# Patient Record
Sex: Male | Born: 1945 | Race: White | Hispanic: No | Marital: Married | Smoking: Former smoker
Health system: Southern US, Community
[De-identification: ages and names within clinical notes are randomized; demographics above are authoritative.]

## PROBLEM LIST (undated history)

## (undated) DIAGNOSIS — M1990 Unspecified osteoarthritis, unspecified site: Secondary | ICD-10-CM

## (undated) DIAGNOSIS — I493 Ventricular premature depolarization: Secondary | ICD-10-CM

## (undated) DIAGNOSIS — C801 Malignant (primary) neoplasm, unspecified: Secondary | ICD-10-CM

## (undated) DIAGNOSIS — Z86010 Personal history of colonic polyps: Secondary | ICD-10-CM

## (undated) DIAGNOSIS — Z860101 Personal history of adenomatous and serrated colon polyps: Secondary | ICD-10-CM

## (undated) HISTORY — DX: Personal history of colon polyps: Z86.010

## (undated) HISTORY — PX: EYE SURGERY: SHX253

## (undated) HISTORY — PX: COLONOSCOPY: SHX174

## (undated) HISTORY — DX: Ventricular premature depolarization: I49.3

## (undated) HISTORY — PX: TONSILLECTOMY: SUR1361

## (undated) HISTORY — DX: Personal history of adenomatous and serrated colon polyps: Z86.0101

---

## 2002-07-21 ENCOUNTER — Ambulatory Visit (HOSPITAL_COMMUNITY): Admission: RE | Admit: 2002-07-21 | Discharge: 2002-07-21 | Payer: Self-pay | Admitting: Internal Medicine

## 2003-05-28 ENCOUNTER — Encounter: Payer: Self-pay | Admitting: Internal Medicine

## 2003-05-28 ENCOUNTER — Ambulatory Visit (HOSPITAL_COMMUNITY): Admission: RE | Admit: 2003-05-28 | Discharge: 2003-05-28 | Payer: Self-pay | Admitting: Internal Medicine

## 2008-12-29 ENCOUNTER — Encounter (HOSPITAL_COMMUNITY): Admission: RE | Admit: 2008-12-29 | Discharge: 2009-01-28 | Payer: Self-pay | Admitting: Orthopedic Surgery

## 2010-12-30 NOTE — Op Note (Signed)
NAME:  Erik Murray, Erik Murray NO.:  [id number]   MEDICAL RECORD NO.:  [id number]                   PATIENT TYPE:  AMB   LOCATION:  DAY                                  FACILITY:  [facility]   PHYSICIAN:  R. Roetta Sessions, M.D.              DATE OF BIRTH:  [birth year]   DATE OF PROCEDURE:  07/21/2002  DATE OF DISCHARGE:                                 OPERATIVE REPORT   PROCEDURE:  Colonoscopy with biopsy and snare polypectomy.   INDICATION FOR PROCEDURE:  The patient is a 65 year old gentleman referred  for colorectal cancer screening by Kingsley Callander. Ouida Sills, M.D.  He has had a  sigmoidoscopy years ago by Dr. Esmeralda Arthur but never had a full colon.  He has no  GI symptoms.  There is no family history of colorectal neoplasia.  Colonoscopy is now being done as a screening maneuver.  This approach has  been discussed with the patient at length at the bedside.  Potential risks,  benefits, and alternatives have been reviewed and questions answered.  He is  agreeable.  Please see my handwritten H&P for more information.  ASA-1.   DESCRIPTION OF PROCEDURE:  O2 saturation, blood pressure, pulse, and  respirations were monitored throughout the entire procedure.   Conscious sedation:  Versed 6 mg IV and Demerol 75 mg IV, Glucagon 0.5 mg IV  to arrest small bowel motility.   FINDINGS:  Digital rectal exam revealed no abnormalities.   Endoscopic findings:  The prep was good.   Rectum:  Examination of the rectal mucosa, including retroflexed view of the  anal verge, revealed no abnormalities.   Colon:  The colonic mucosa was surveyed from the rectosigmoid junction  through the left, transverse, and right colon to the area of the appendiceal  orifice, ileocecal valve, and cecum.  These structures were well-seen and  photographed.  The patient was noted to have a 3 mm polyp in the mid-right  colon, which was cold biopsied and removed.  There was a questionable polyp  at the base of  the ileocecal valve.  It was notable there was a considerable  amount of spasm in the right colon, which made confirming this difficult.  I  eventually gave the patient Glucagon 0.5 mg IV to slow small bowel motility  down, and I washed this area well and could not find a polyp.  From this  level the scope was slowly withdrawn.  All previously-mentioned mucosal  surfaces were again seen.  In the hepatic flexure there was a 5 mm polyp,  which was initially biopsied and appeared to be on a stalk, and I left it to  remove the remainder of it with a snare.  This was done without difficulty.  I continued to pull the scope back and re-examined the colon and once again  was unable to find any  other abnormalities.  The patient tolerated the  procedure well and was  reactivated in endoscopy.   IMPRESSION:  1. Normal rectum.  2. Polyp in the hepatic flexure snared.  Polyp in the right colon cold     biopsied/removed.  3. The remainder of the colonic mucosa appeared normal.    RECOMMENDATIONS:  1. No aspirin or arthritis medications for 10 days.  2. Follow up on pathology.  3. Further recommendations to follow.                                               Jonathon Bellows, M.D.    RMR/MEDQ  D:  07/21/2002  T:  07/21/2002  Job:  [id number]   cc:   Kingsley Callander. Ouida Sills, M.D.  [address]  [place]  [place] [address]  Fax: [phone]

## 2011-10-04 ENCOUNTER — Encounter (HOSPITAL_BASED_OUTPATIENT_CLINIC_OR_DEPARTMENT_OTHER): Payer: Self-pay | Admitting: *Deleted

## 2011-10-04 ENCOUNTER — Other Ambulatory Visit: Payer: Self-pay | Admitting: Orthopedic Surgery

## 2011-10-04 NOTE — Progress Notes (Signed)
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

## 2011-10-10 ENCOUNTER — Encounter (HOSPITAL_BASED_OUTPATIENT_CLINIC_OR_DEPARTMENT_OTHER): Payer: Self-pay | Admitting: *Deleted

## 2011-10-10 ENCOUNTER — Encounter (HOSPITAL_BASED_OUTPATIENT_CLINIC_OR_DEPARTMENT_OTHER): Payer: Self-pay | Admitting: Certified Registered"

## 2011-10-10 ENCOUNTER — Ambulatory Visit (HOSPITAL_BASED_OUTPATIENT_CLINIC_OR_DEPARTMENT_OTHER): Payer: BC Managed Care – PPO | Admitting: Certified Registered"

## 2011-10-10 ENCOUNTER — Encounter (HOSPITAL_BASED_OUTPATIENT_CLINIC_OR_DEPARTMENT_OTHER): Payer: Self-pay | Admitting: Orthopedic Surgery

## 2011-10-10 ENCOUNTER — Encounter (HOSPITAL_BASED_OUTPATIENT_CLINIC_OR_DEPARTMENT_OTHER)
Admission: RE | Disposition: A | Discharge status: Home / Self Care | Payer: Self-pay | Source: Ambulatory Visit | Attending: Orthopedic Surgery

## 2011-10-10 ENCOUNTER — Ambulatory Visit (HOSPITAL_BASED_OUTPATIENT_CLINIC_OR_DEPARTMENT_OTHER)
Admission: RE | Admit: 2011-10-10 | Discharge: 2011-10-10 | Disposition: A | Discharge status: Home / Self Care | Payer: BC Managed Care – PPO | Source: Ambulatory Visit | Attending: Orthopedic Surgery | Admitting: Orthopedic Surgery

## 2011-10-10 DIAGNOSIS — M129 Arthropathy, unspecified: Secondary | ICD-10-CM | POA: Insufficient documentation

## 2011-10-10 DIAGNOSIS — M65839 Other synovitis and tenosynovitis, unspecified forearm: Secondary | ICD-10-CM | POA: Insufficient documentation

## 2011-10-10 DIAGNOSIS — M6530 Trigger finger, unspecified finger: Secondary | ICD-10-CM | POA: Insufficient documentation

## 2011-10-10 HISTORY — DX: Unspecified osteoarthritis, unspecified site: M19.90

## 2011-10-10 HISTORY — PX: TRIGGER FINGER RELEASE: SHX641

## 2011-10-10 SURGERY — RELEASE, A1 PULLEY, FOR TRIGGER FINGER
Anesthesia: Monitor Anesthesia Care | Laterality: Right

## 2011-10-10 MED ORDER — CEFAZOLIN SODIUM-DEXTROSE 2-3 GM-% IV SOLR: 2.0000 g | INTRAVENOUS | Status: DC

## 2011-10-10 MED ORDER — ONDANSETRON HCL 4 MG/2ML IJ SOLN
INTRAMUSCULAR | Status: DC | PRN
  Administered 2011-10-10: 4 mg via INTRAVENOUS

## 2011-10-10 MED ORDER — CHLORHEXIDINE GLUCONATE 4 % EX LIQD: 60.0000 mL | Freq: Once | CUTANEOUS | Status: DC

## 2011-10-10 MED ORDER — LACTATED RINGERS IV SOLN
INTRAVENOUS | Status: DC
  Administered 2011-10-10: 08:00:00 via INTRAVENOUS

## 2011-10-10 MED ORDER — BUPIVACAINE HCL (PF) 0.25 % IJ SOLN
INTRAMUSCULAR | Status: DC | PRN
  Administered 2011-10-10: 7 mL

## 2011-10-10 MED ORDER — MIDAZOLAM HCL 2 MG/2ML IJ SOLN: 0.5000 mg | INTRAMUSCULAR | Status: DC | PRN

## 2011-10-10 MED ORDER — FENTANYL CITRATE 0.05 MG/ML IJ SOLN
INTRAMUSCULAR | Status: DC | PRN
  Administered 2011-10-10: 50 ug via INTRAVENOUS

## 2011-10-10 MED ORDER — HYDROCODONE-ACETAMINOPHEN 5-500 MG PO TABS: 1.0000 | ORAL_TABLET | ORAL | Status: AC | PRN

## 2011-10-10 MED ORDER — PROPOFOL 10 MG/ML IV EMUL
INTRAVENOUS | Status: DC | PRN
  Administered 2011-10-10: 45 ug/kg/min via INTRAVENOUS

## 2011-10-10 MED ORDER — LIDOCAINE HCL (CARDIAC) 20 MG/ML IV SOLN
INTRAVENOUS | Status: DC | PRN
  Administered 2011-10-10: 40 mg via INTRAVENOUS

## 2011-10-10 MED ORDER — FENTANYL CITRATE 0.05 MG/ML IJ SOLN: 50.0000 ug | INTRAMUSCULAR | Status: DC | PRN

## 2011-10-10 MED ORDER — LIDOCAINE HCL (PF) 0.5 % IJ SOLN
INTRAMUSCULAR | Status: DC | PRN
  Administered 2011-10-10: 35 mL via INTRATHECAL

## 2011-10-10 MED ORDER — CEFAZOLIN SODIUM 1-5 GM-% IV SOLN: 1.0000 g | INTRAVENOUS | Status: DC

## 2011-10-10 MED ORDER — MIDAZOLAM HCL 5 MG/5ML IJ SOLN
INTRAMUSCULAR | Status: DC | PRN
  Administered 2011-10-10: 0.5 mg via INTRAVENOUS
  Administered 2011-10-10: 1 mg via INTRAVENOUS

## 2011-10-10 MED ORDER — CEFAZOLIN SODIUM-DEXTROSE 2-3 GM-% IV SOLR
2.0000 g | INTRAVENOUS | Status: DC
  Administered 2011-10-10: 2 g via INTRAVENOUS

## 2011-10-10 SURGICAL SUPPLY — 32 items
BANDAGE COBAN STERILE 2 (GAUZE/BANDAGES/DRESSINGS) ×2 IMPLANT
BLADE SURG 15 STRL LF DISP TIS (BLADE) ×1 IMPLANT
BLADE SURG 15 STRL SS (BLADE) ×2
BNDG CMPR 9X4 STRL LF SNTH (GAUZE/BANDAGES/DRESSINGS) ×1
BNDG ESMARK 4X9 LF (GAUZE/BANDAGES/DRESSINGS) ×1 IMPLANT
CHLORAPREP W/TINT 26ML (MISCELLANEOUS) ×2 IMPLANT
CLOTH BEACON ORANGE TIMEOUT ST (SAFETY) ×2 IMPLANT
CORDS BIPOLAR (ELECTRODE) IMPLANT
COVER MAYO STAND STRL (DRAPES) ×2 IMPLANT
COVER TABLE BACK 60X90 (DRAPES) ×2 IMPLANT
CUFF TOURNIQUET SINGLE 18IN (TOURNIQUET CUFF) ×1 IMPLANT
DECANTER SPIKE VIAL GLASS SM (MISCELLANEOUS) IMPLANT
DRAPE EXTREMITY T 121X128X90 (DRAPE) ×2 IMPLANT
DRAPE SURG 17X23 STRL (DRAPES) ×2 IMPLANT
GAUZE XEROFORM 1X8 LF (GAUZE/BANDAGES/DRESSINGS) ×2 IMPLANT
GLOVE BIO SURGEON STRL SZ 6.5 (GLOVE) ×1 IMPLANT
GLOVE SURG ORTHO 8.0 STRL STRW (GLOVE) ×2 IMPLANT
GOWN BRE IMP PREV XXLGXLNG (GOWN DISPOSABLE) ×2 IMPLANT
GOWN PREVENTION PLUS XLARGE (GOWN DISPOSABLE) ×2 IMPLANT
NEEDLE 27GAX1X1/2 (NEEDLE) ×1 IMPLANT
NS IRRIG 1000ML POUR BTL (IV SOLUTION) ×2 IMPLANT
PACK BASIN DAY SURGERY FS (CUSTOM PROCEDURE TRAY) ×2 IMPLANT
PADDING CAST ABS 4INX4YD NS (CAST SUPPLIES)
PADDING CAST ABS COTTON 4X4 ST (CAST SUPPLIES) ×1 IMPLANT
SPONGE GAUZE 4X4 12PLY (GAUZE/BANDAGES/DRESSINGS) ×2 IMPLANT
STOCKINETTE 4X48 STRL (DRAPES) ×2 IMPLANT
SUT VICRYL RAPIDE 4/0 PS 2 (SUTURE) ×2 IMPLANT
SYR BULB 3OZ (MISCELLANEOUS) ×2 IMPLANT
SYR CONTROL 10ML LL (SYRINGE) ×1 IMPLANT
TOWEL OR 17X24 6PK STRL BLUE (TOWEL DISPOSABLE) ×4 IMPLANT
UNDERPAD 30X30 INCONTINENT (UNDERPADS AND DIAPERS) ×2 IMPLANT
WATER STERILE IRR 1000ML POUR (IV SOLUTION) ×1 IMPLANT

## 2011-10-10 NOTE — Brief Op Note (Signed)
10/10/2011  9:12 AM  PATIENT:  Erik Murray  66 y.o. male  PRE-OPERATIVE DIAGNOSIS:  Stenosising Tenosynovitis right thumb  POST-OPERATIVE DIAGNOSIS:  Stenosising Tenosynovitis right thumb  PROCEDURE:  Procedure(s) (LRB): RELEASE TRIGGER FINGER/A-1 PULLEY (Right)  SURGEON:  Surgeon(s) and Role:    * Nicki Reaper, MD - Primary  PHYSICIAN ASSISTANT:   ASSISTANTS: none   ANESTHESIA:   local and regional  EBL:  Total I/O In: 700 [I.V.:700] Out: -   BLOOD ADMINISTERED:none  DRAINS: none   LOCAL MEDICATIONS USED:  MARCAINE     SPECIMEN:  No Specimen  DISPOSITION OF SPECIMEN:  N/A  COUNTS:  YES  TOURNIQUET:  * Missing tourniquet times found for documented tourniquets in log:  [id number] *  DICTATION: .Other Dictation: Dictation Number [id number]  PLAN OF CARE: Discharge to home after PACU  PATIENT DISPOSITION:  PACU - hemodynamically stable.

## 2011-10-10 NOTE — Anesthesia Postprocedure Evaluation (Signed)
Anesthesia Post Note  Patient: Erik Murray  Procedure(s) Performed: Procedure(s) (LRB): RELEASE TRIGGER FINGER/A-1 PULLEY (Right)  Anesthesia type: General  Patient location: PACU  Post pain: Pain level controlled  Post assessment: Patient's Cardiovascular Status Stable  Last Vitals:  Filed Vitals:   10/10/11 0945  BP: 119/64  Pulse: 60  Temp:   Resp: 14    Post vital signs: Reviewed and stable  Level of consciousness: alert  Complications: No apparent anesthesia complications

## 2011-10-10 NOTE — Anesthesia Procedure Notes (Signed)
Procedure Name: MAC Date/Time: 10/10/2011 8:47 AM Performed by: Radford Pax Pre-anesthesia Checklist: Patient identified, Emergency Drugs available, Suction available, Patient being monitored and Timeout performed Patient Re-evaluated:Patient Re-evaluated prior to inductionOxygen Delivery Method: Simple face mask

## 2011-10-10 NOTE — H&P (Signed)
Erik Murray  is 23 and  comes in complaining of catching of his right thumb. He is referred by Dr. Carylon Perches for this. He states this began in September 2012 and is increasingly getting worse. He has tried Aspercreme and heat to no avail. He has no history of injury. He is not complaining of any numbness or tingling. He states it is very similar to the problem he had on his little finger. No history of diabetes, thyroid problems, arthritis or gout. He complains of intermittent, moderate, sharp pain. Activity makes this worse and inactivity also increases symptoms for him. He complains of it catching.    Past Medical History: He has no allergies and takes no medicines. He has had no surgery.  Family Medical History: Positive for diabetes, heart disease, high BP and arthritis.  Social History: He does not smoke. He drinks socially. He is married and a judge.  Review of Systems: Positive for glasses, otherwise negative for 14 points. Erik Murray is an 66 y.o. male.     Chief Complaint: STS Rt thumb HPI: seeabove  Past Medical History  Diagnosis Date  . No pertinent past medical history   . Arthritis     Past Surgical History  Procedure Date  . Tonsillectomy   . Colonoscopy     History reviewed. No pertinent family history. Social History:  reports that he has quit smoking. He quit smokeless tobacco use about 6 years ago. He reports that he drinks alcohol. His drug history not on file.  Allergies: No Known Allergies  Medications Prior to Admission  Medication Dose Route Frequency Provider Last Rate Last Dose  . ceFAZolin (ANCEF) IVPB 2 g/50 mL premix  2 g Intravenous 60 min Pre-Op Nicki Reaper, MD      . chlorhexidine (HIBICLENS) 4 % liquid 4 application  60 mL Topical Once Nicki Reaper, MD      . fentaNYL (SUBLIMAZE) injection 50-100 mcg  50-100 mcg Intravenous PRN Constance Goltz, MD      . lactated ringers infusion   Intravenous Continuous Constance Goltz, MD 10 mL/hr at 10/10/11 [phone]    . midazolam (VERSED) injection 0.5-2 mg  0.5-2 mg Intravenous PRN Constance Goltz, MD      . DISCONTD: ceFAZolin (ANCEF) IVPB 1 g/50 mL premix  1 g Intravenous 60 min Pre-Op Nicki Reaper, MD       No current outpatient prescriptions on file as of 10/10/2011.    Results for orders placed during the hospital encounter of 10/10/11 (from the past 48 hour(s))  POCT HEMOGLOBIN-HEMACUE     Status: Normal   Collection Time   10/10/11  7:37 AM      Component Value Range Comment   Hemoglobin 16.1  13.0 - 17.0 (g/dL)     No results found.   Pertinent items are noted in HPI.  Blood pressure 135/84, pulse 63, temperature 97.8 F (36.6 C), temperature source Oral, resp. rate 18, height 5\' 10"  (1.778 m), weight 102.059 kg (225 lb), SpO2 95.00%.  General appearance: alert, cooperative and appears stated age Head: Normocephalic, without obvious abnormality Neck: no adenopathy Resp: clear to auscultation bilaterally Cardio: regular rate and rhythm, S1, S2 normal, no murmur, click, rub or gallop GI: soft, non-tender; bowel sounds normal; no masses,  no organomegaly Extremities: extremities normal, atraumatic, no cyanosis or edema Pulses: 2+ and symmetric Skin: Skin color, texture, turgor normal. No rashes or lesions Neurologic: Grossly normal Incision/Wound: na  Assessment/Plan  This has recurred on his right side. We have injected this twice and would recommend surgical release to the A-1 pulley.  The pre, peri and postoperative course were discussed along with the risks and complications.  The patient is aware there is no guarantee with the surgery, possibility of infection, recurrence, injury to arteries, nerves, tendons, incomplete relief of symptoms and dystrophy.    Mailen Newborn R 10/10/2011, 8:41 AM

## 2011-10-10 NOTE — Op Note (Signed)
NAME:  Erik Murray, MILES NO.:  [id number]  MEDICAL RECORD NO.:  [id number]  LOCATION:                                 FACILITY:  PHYSICIAN:  Cindee Salt, M.D.            DATE OF BIRTH:  DATE OF PROCEDURE:  10/10/2011 DATE OF DISCHARGE:                              OPERATIVE REPORT   PREOPERATIVE DIAGNOSIS:  Stenosing tenosynovitis, right thumb.  POSTOPERATIVE DIAGNOSIS:  Stenosing tenosynovitis, right thumb.  OPERATION:  Release A1 pulley, right thumb.  SURGEON:  Cindee Salt, M.D.  ANESTHESIA:  Forearm-based IV regional with local infiltration .  ANESTHESIOLOGIST:  Janetta Hora. Gelene Mink, M.D.  HISTORY:  The patient is a 66 year old male with a history of triggering of his right thumb.  This has not responded to 2 injections of cortisone.  He is admitted now for surgical release.  Pre, peri, postoperative course, been discussed along with risks and complications. He is aware there is no guarantee with the surgery, possibility of infection, recurrence, injury to arteries, nerves, tendons, incomplete relief of symptoms, dystrophy.  Preoperative area, the patient is seen, the extremity marked by both the patient and surgeon.  Antibiotic given.  PROCEDURE:  The patient was brought to the operating room where a forearm-based IV regional anesthetic was carried out without difficulty. He was prepped using ChloraPrep, supine position with the right arm free.  A 3 minutes dry time was allowed.  Time-out taken to confirm patient procedure.  A transverse incision was made over the A1 pulley of the right thumb, carried down through subcutaneous tissue.  The A1 pulley was noted, this was found to be markedly thickened.  Retractors placed protecting the radial and ulnar neurovascular bundles.  The A1 pulley was then released on its radial aspect.  The oblique pulley was left intact.  Finger placed through full range of motion, no further triggering was noted.  The wound was  irrigated.  The skin was closed interrupted 4-0 Vicryl Rapide sutures.  A local infiltration with 0.25% Marcaine was given approximately 7 mL was used.  A sterile compressive dressing was applied.  Deflation of the tourniquet, all fingers immediately pinked.  He was taken to the recovery room for observation in satisfactory condition.          ______________________________ Cindee Salt, M.D.    GK/MEDQ  D:  10/10/2011  T:  10/10/2011  Job:  [id number]

## 2011-10-10 NOTE — Anesthesia Preprocedure Evaluation (Addendum)
Anesthesia Evaluation  Patient identified by MRN, date of birth, ID band Patient awake    Reviewed: Allergy & Precautions, H&P , NPO status , Patient's Chart, lab work & pertinent test results, reviewed documented beta blocker date and time   Airway Mallampati: II TM Distance: >3 FB Neck ROM: full    Dental   Pulmonary neg pulmonary ROS,          Cardiovascular neg cardio ROS     Neuro/Psych Negative Neurological ROS  Negative Psych ROS   GI/Hepatic negative GI ROS, Neg liver ROS,   Endo/Other  Negative Endocrine ROS  Renal/GU negative Renal ROS  Genitourinary negative   Musculoskeletal   Abdominal   Peds  Hematology negative hematology ROS (+)   Anesthesia Other Findings See surgeon's H&P   Reproductive/Obstetrics negative OB ROS                          Anesthesia Physical Anesthesia Plan  ASA: II  Anesthesia Plan: MAC and Bier Block   Post-op Pain Management:    Induction: Intravenous  Airway Management Planned: Simple Face Mask  Additional Equipment:   Intra-op Plan:   Post-operative Plan:   Informed Consent: I have reviewed the patients History and Physical, chart, labs and discussed the procedure including the risks, benefits and alternatives for the proposed anesthesia with the patient or authorized representative who has indicated his/her understanding and acceptance.     Plan Discussed with: CRNA and Surgeon  Anesthesia Plan Comments:         Anesthesia Quick Evaluation

## 2011-10-10 NOTE — Transfer of Care (Signed)
Immediate Anesthesia Transfer of Care Note  Patient: Erik Murray  Procedure(s) Performed: Procedure(s) (LRB): RELEASE TRIGGER FINGER/A-1 PULLEY (Right)  Patient Location: PACU  Anesthesia Type: Bier block  Level of Consciousness: awake, alert , oriented and patient cooperative  Airway & Oxygen Therapy: Patient Spontanous Breathing and Patient connected to face mask oxygen  Post-op Assessment: Report given to PACU RN and Post -op Vital signs reviewed and stable  Post vital signs: Reviewed and stable  Complications: No apparent anesthesia complications

## 2011-10-10 NOTE — Op Note (Signed)
Dictated number: [id number]

## 2011-10-10 NOTE — Discharge Instructions (Signed)
[facility] Instructions Hand Surgery  Wound Care: Keep your hand elevated above the level of your heart.  Do not allow it to dangle  by your side.  Keep the dressing dry and do not remove it unless your doctor advises you to do so.  He will usually change it at the time of your post-op visit.  Moving your fingers is advised to stimulate circulation but will depend on the site of your surgery.  If you have a splint applied, your doctor will advise you regarding movement.  Activity: Do not drive or operate machinery today.  Rest today and then you may return to your normal activity and work as indicated by your physician.  Diet:  Drink liquids today or eat a light diet.  You may resume a regular diet tomorrow.    General expectations: Pain for two to three days. Fingers may become slightly swollen.  Call your doctor if any of the following occur: Severe pain not relieved by pain medication. Elevated temperature. Dressing soaked with blood. Inability to move fingers. White or bluish color to fingers.[facility]  [address] [place], [place] [address] [phone]   Post Anesthesia Home Care Instructions  Activity: Get plenty of rest for the remainder of the day. A responsible adult should stay with you for 24 hours following the procedure.  For the next 24 hours, DO NOT: -Drive a car -Operate machinery -Drink alcoholic beverages -Take any medication unless instructed by your physician -Make any legal decisions or sign important papers.  Meals: Start with liquid foods such as gelatin or soup. Progress to regular foods as tolerated. Avoid greasy, spicy, heavy foods. If nausea and/or vomiting occur, drink only clear liquids until the nausea and/or vomiting subsides. Call your physician if vomiting continues.  Special Instructions/Symptoms: Your throat may feel dry or sore from the anesthesia or the breathing tube placed in your throat during surgery. If  this causes discomfort, gargle with warm salt water. The discomfort should disappear within 24 hours.   

## 2011-10-11 ENCOUNTER — Encounter (HOSPITAL_BASED_OUTPATIENT_CLINIC_OR_DEPARTMENT_OTHER): Payer: Self-pay | Admitting: Orthopedic Surgery

## 2011-12-05 ENCOUNTER — Encounter (INDEPENDENT_AMBULATORY_CARE_PROVIDER_SITE_OTHER): Payer: Self-pay | Admitting: *Deleted

## 2011-12-28 ENCOUNTER — Telehealth: Payer: Self-pay

## 2011-12-28 ENCOUNTER — Other Ambulatory Visit: Payer: Self-pay

## 2011-12-28 DIAGNOSIS — Z139 Encounter for screening, unspecified: Secondary | ICD-10-CM

## 2011-12-28 NOTE — Telephone Encounter (Signed)
OK to proceed with colonoscopy.

## 2011-12-28 NOTE — Telephone Encounter (Signed)
Rx and instructions mailed to pt.  

## 2011-12-28 NOTE — Telephone Encounter (Signed)
Gastroenterology Pre-Procedure Form       Request Date: 12/28/2011      Requesting Physician: Pt called     PATIENT INFORMATION:  Erik Murray is a 67 y.o., male (DOB=12-24-1945).  PROCEDURE: Procedure(s) requested: colonoscopy Procedure Reason: screening for colon cancer  PATIENT REVIEW QUESTIONS: The patient reports the following:   1. Diabetes Melitis: no 2. Joint replacements in the past 12 months: no 3. Major health problems in the past 3 months: no 4. Has an artificial valve or MVP:no 5. Has been advised in past to take antibiotics in advance of a procedure like teeth cleaning: no}    MEDICATIONS & ALLERGIES:    Patient reports the following regarding taking any blood thinners:   Plavix? no Aspirin?yes  Coumadin?  no  Patient confirms/reports the following medications:  Current Outpatient Prescriptions  Medication Sig Dispense Refill  . aspirin 81 MG tablet Take 81 mg by mouth daily.      . Multiple Vitamins-Minerals (MULTIVITAMIN WITH MINERALS) tablet Take 1 tablet by mouth daily.        Patient confirms/reports the following allergies:  No Known Allergies  Patient is appropriate to schedule for requested procedure(s): yes  AUTHORIZATION INFORMATION Primary Insurance:   ID #: Group #:  Pre-Cert / Auth required: Pre-Cert / Auth #:   Secondary Insurance:  ID #:   Group #:  Pre-Cert / Auth required:  Pre-Cert / Auth #:   No orders of the defined types were placed in this encounter.    SCHEDULE INFORMATION: Procedure has been scheduled as follows:  Date:   02/05/2012        Time: 8:15 AM Location: [facility] Short Stay  This Gastroenterology Pre-Precedure Form is being routed to the following provider(s) for review: R. Roetta Sessions, MD

## 2012-01-23 ENCOUNTER — Telehealth: Payer: Self-pay

## 2012-01-23 NOTE — Telephone Encounter (Signed)
Pt returned call. He has not had any new problems and no new medications.

## 2012-01-23 NOTE — Telephone Encounter (Signed)
LMOM to call to update triage.  

## 2012-01-23 NOTE — Telephone Encounter (Signed)
Okay to proceed with colonoscopy.

## 2012-01-29 ENCOUNTER — Encounter (HOSPITAL_COMMUNITY): Payer: Self-pay | Admitting: Pharmacy Technician

## 2012-02-02 MED ORDER — SODIUM CHLORIDE 0.45 % IV SOLN
Freq: Once | INTRAVENOUS | Status: AC
  Administered 2012-02-05: 08:00:00 via INTRAVENOUS

## 2012-02-05 ENCOUNTER — Encounter (HOSPITAL_COMMUNITY)
Admission: RE | Disposition: A | Discharge status: Home / Self Care | Payer: Self-pay | Source: Ambulatory Visit | Attending: Internal Medicine

## 2012-02-05 ENCOUNTER — Encounter (HOSPITAL_COMMUNITY): Payer: Self-pay | Admitting: *Deleted

## 2012-02-05 ENCOUNTER — Ambulatory Visit (HOSPITAL_COMMUNITY)
Admission: RE | Admit: 2012-02-05 | Discharge: 2012-02-05 | Disposition: A | Discharge status: Home / Self Care | Payer: BC Managed Care – PPO | Source: Ambulatory Visit | Attending: Internal Medicine | Admitting: Internal Medicine

## 2012-02-05 DIAGNOSIS — K621 Rectal polyp: Secondary | ICD-10-CM

## 2012-02-05 DIAGNOSIS — D129 Benign neoplasm of anus and anal canal: Secondary | ICD-10-CM | POA: Insufficient documentation

## 2012-02-05 DIAGNOSIS — D128 Benign neoplasm of rectum: Secondary | ICD-10-CM | POA: Insufficient documentation

## 2012-02-05 DIAGNOSIS — K620 Anal polyp: Secondary | ICD-10-CM

## 2012-02-05 DIAGNOSIS — K5730 Diverticulosis of large intestine without perforation or abscess without bleeding: Secondary | ICD-10-CM | POA: Insufficient documentation

## 2012-02-05 DIAGNOSIS — Z1211 Encounter for screening for malignant neoplasm of colon: Secondary | ICD-10-CM | POA: Insufficient documentation

## 2012-02-05 DIAGNOSIS — Z139 Encounter for screening, unspecified: Secondary | ICD-10-CM

## 2012-02-05 DIAGNOSIS — D126 Benign neoplasm of colon, unspecified: Secondary | ICD-10-CM | POA: Insufficient documentation

## 2012-02-05 HISTORY — PX: COLONOSCOPY: SHX5424

## 2012-02-05 SURGERY — COLONOSCOPY
Anesthesia: Moderate Sedation

## 2012-02-05 MED ORDER — STERILE WATER FOR IRRIGATION IR SOLN
Status: DC | PRN
  Administered 2012-02-05: 09:00:00

## 2012-02-05 MED ORDER — SODIUM CHLORIDE 0.9 % IJ SOLN
INTRAMUSCULAR | Status: DC | PRN
  Administered 2012-02-05: 3 mL

## 2012-02-05 MED ORDER — MEPERIDINE HCL 100 MG/ML IJ SOLN
INTRAMUSCULAR | Status: DC | PRN
  Administered 2012-02-05: 25 mg via INTRAVENOUS
  Administered 2012-02-05: 50 mg via INTRAVENOUS

## 2012-02-05 MED ORDER — MIDAZOLAM HCL 5 MG/5ML IJ SOLN
INTRAMUSCULAR | Status: AC
  Filled 2012-02-05: qty 10

## 2012-02-05 MED ORDER — MEPERIDINE HCL 100 MG/ML IJ SOLN
INTRAMUSCULAR | Status: AC
  Filled 2012-02-05: qty 1

## 2012-02-05 MED ORDER — MIDAZOLAM HCL 5 MG/5ML IJ SOLN
INTRAMUSCULAR | Status: DC | PRN
  Administered 2012-02-05 (×3): 1 mg via INTRAVENOUS
  Administered 2012-02-05: 2 mg via INTRAVENOUS

## 2012-02-05 NOTE — Op Note (Signed)
[facility] [address] [place], [place]  [address]  COLONOSCOPY PROCEDURE REPORT  PATIENT:  Erik, Murray  MR#:  [id number] BIRTHDATE:  08/27/1945, 66 yrs. old  GENDER:  male ENDOSCOPIST:  R. Roetta Sessions, MD FACP [facility] REF. BY:          Dr. Ouida Sills PROCEDURE DATE:  02/05/2012 PROCEDURE:  Colonoscopy with biopsy, ablation, saline assisted polypectomy  INDICATIONS:  Average risk colorectal cancer screening.  INFORMED CONSENT:  The risks, benefits, alternatives and imponderables including but not limited to bleeding, perforation as well as the possibility of a missed lesion have been reviewed. The potential for biopsy, lesion removal, etc. have also been discussed.  Questions have been answered.  All parties agreeable. Please see the history and physical in the medical record for more information.  MEDICATIONS:  Versed 5 mg IV and Demerol 75 mg IV in divided doses.  DESCRIPTION OF PROCEDURE:  After a digital rectal exam was performed, the EC-[id number] ([id number]) colonoscope was advanced from the anus through the rectum and colon to the area of the cecum, ileocecal valve and appendiceal orifice.  The cecum was deeply intubated.  These structures were well-seen and photographed for the record.  From the level of the cecum and ileocecal valve, the scope was slowly and cautiously withdrawn.  The mucosal surfaces were carefully surveyed utilizing scope tip deflection to facilitate fold flattening as needed.  The scope was pulled down into the rectum where a thorough examination including retroflexion was performed. <<PROCEDUREIMAGES>>  FINDINGS: Good preparation. Multiple rectal polyps - the largest being 5 mm, 5 cm from the anal verge. 2 adjacent diminutive polyps; otherwise normal rectum. Couple of descending colon diverticula. In the cecum there is a single diminutive polyp of adjacent to the appendiceal orifice; at the appendiceal orifice, there were 2  hyperplastic appearing polyps arising out of  2 folds in the base of the appendix. Please see photos. The remainder of the colonic mucosa appeared normal.  THERAPEUTIC / DIAGNOSTIC MANEUVERS PERFORMED:  The lone cecal polyp was cold biopsy / removed. The 2 hyperplastic appearing polyps arising out of a fold in the base of the appendix were lifted away from the colonic wall with about 1.5 cc of normal saline injected submucosally. Subsequently, these were hot snare removed. Questionable area residual polyp at the periphery of the appendical polypectomy site was ablated with the tip of the hot snare cautery unit. This was done without difficulty or apparent complication.  The largest rectal polyp was hot snare/removed. 2 adjacent smaller polyps were ablated with the tip hot snare cautery unit.  COMPLICATIONS: None  CECAL WITHDRAWAL TIME: 38 minutes  IMPRESSION: Colonic and rectal polyps-treated as described above. Few colonic diverticulosis  RECOMMENDATIONS: Followup on pathology.  ______________________________ R. Roetta Sessions, MD Caleen Essex  CC:  n. eSIGNED:   R. Roetta Sessions at 02/05/2012 09:47 AM  Kellie Shropshire, [phone]

## 2012-02-05 NOTE — Discharge Instructions (Signed)
Colonoscopy Discharge Instructions  Read the instructions outlined below and refer to this sheet in the next few weeks. These discharge instructions provide you with general information on caring for yourself after you leave the hospital. Your doctor may also give you specific instructions. While your treatment has been planned according to the most current medical practices available, unavoidable complications occasionally occur. If you have any problems or questions after discharge, call Dr. Jena Gauss at [phone]. ACTIVITY  You may resume your regular activity, but move at a slower pace for the next 24 hours.   Take frequent rest periods for the next 24 hours.   Walking will help get rid of the air and reduce the bloated feeling in your belly (abdomen).   No driving for 24 hours (because of the medicine (anesthesia) used during the test).    Do not sign any important legal documents or operate any machinery for 24 hours (because of the anesthesia used during the test).  NUTRITION  Drink plenty of fluids.   You may resume your normal diet as instructed by your doctor.   Begin with a light meal and progress to your normal diet. Heavy or fried foods are harder to digest and may make you feel sick to your stomach (nauseated).   Avoid alcoholic beverages for 24 hours or as instructed.  MEDICATIONS  You may resume your normal medications unless your doctor tells you otherwise.  WHAT YOU CAN EXPECT TODAY  Some feelings of bloating in the abdomen.   Passage of more gas than usual.   Spotting of blood in your stool or on the toilet paper.  IF YOU HAD POLYPS REMOVED DURING THE COLONOSCOPY:  No aspirin products for 7 days or as instructed.   No alcohol for 7 days or as instructed.   Eat a soft diet for the next 24 hours.  FINDING OUT THE RESULTS OF YOUR TEST Not all test results are available during your visit. If your test results are not back during the visit, make an appointment  with your caregiver to find out the results. Do not assume everything is normal if you have not heard from your caregiver or the medical facility. It is important for you to follow up on all of your test results.  SEEK IMMEDIATE MEDICAL ATTENTION IF:  You have more than a spotting of blood in your stool.   Your belly is swollen (abdominal distention).   You are nauseated or vomiting.   You have a temperature over 101.   You have abdominal pain or discomfort that is severe or gets worse throughout the day.    Polyp and diverticulosis information provided.  Further recommendations to follow pending review of pathology report.  Colon Polyps A polyp is extra tissue that grows inside your body. Colon polyps grow in the large intestine. The large intestine, also called the colon, is part of your digestive system. It is a long, hollow tube at the end of your digestive tract where your body makes and stores stool. Most polyps are not dangerous. They are benign. This means they are not cancerous. But over time, some types of polyps can turn into cancer. Polyps that are smaller than a pea are usually not harmful. But larger polyps could someday become or may already be cancerous. To be safe, doctors remove all polyps and test them.  WHO GETS POLYPS? Anyone can get polyps, but certain people are more likely than others. You may have a greater chance of getting polyps  if:  You are over 50.   You have had polyps before.   Someone in your family has had polyps.   Someone in your family has had cancer of the large intestine.   Find out if someone in your family has had polyps. You may also be more likely to get polyps if you:   Eat a lot of fatty foods.   Smoke.   Drink alcohol.   Do not exercise.   Eat too much.  SYMPTOMS  Most small polyps do not cause symptoms. People often do not know they have one until their caregiver finds it during a regular checkup or while testing them for  something else. Some people do have symptoms like these:  Bleeding from the anus. You might notice blood on your underwear or on toilet paper after you have had a bowel movement.   Constipation or diarrhea that lasts more than a week.   Blood in the stool. Blood can make stool look black or it can show up as red streaks in the stool.  If you have any of these symptoms, see your caregiver. HOW DOES THE DOCTOR TEST FOR POLYPS? The doctor can use four tests to check for polyps:  Digital rectal exam. The caregiver wears gloves and checks your rectum (the last part of the large intestine) to see if it feels normal. This test would find polyps only in the rectum. Your caregiver may need to do one of the other tests listed below to find polyps higher up in the intestine.   Barium enema. The caregiver puts a liquid called barium into your rectum before taking x-rays of your large intestine. Barium makes your intestine look white in the pictures. Polyps are dark, so they are easy to see.   Sigmoidoscopy. With this test, the caregiver can see inside your large intestine. A thin flexible tube is placed into your rectum. The device is called a sigmoidoscope, which has a light and a tiny video camera in it. The caregiver uses the sigmoidoscope to look at the last third of your large intestine.   Colonoscopy. This test is like sigmoidoscopy, but the caregiver looks at all of the large intestine. It usually requires sedation. This is the most common method for finding and removing polyps.  TREATMENT   The caregiver will remove the polyp during sigmoidoscopy or colonoscopy. The polyp is then tested for cancer.   If you have had polyps, your caregiver may want you to get tested regularly in the future.  PREVENTION  There is not one sure way to prevent polyps. You might be able to lower your risk of getting them if you:  Eat more fruits and vegetables and less fatty food.   Do not smoke.   Avoid  alcohol.   Exercise every day.   Lose weight if you are overweight.   Eating more calcium and folate can also lower your risk of getting polyps. Some foods that are rich in calcium are milk, cheese, and broccoli. Some foods that are rich in folate are chickpeas, kidney beans, and spinach.   Aspirin might help prevent polyps. Studies are under way.  Document Released: 04/26/2004 Document Revised: 07/20/2011 Document Reviewed: 10/02/2007 [facility] Patient Information 2012 [place], [place].  Diverticulitis A diverticulum is a small pouch or sac on the colon. Diverticulosis is the presence of these diverticula on the colon. Diverticulitis is the irritation (inflammation) or infection of diverticula. CAUSES  The colon and its diverticula contain bacteria. If food  particles block the tiny opening to a diverticulum, the bacteria inside can grow and cause an increase in pressure. This leads to infection and inflammation and is called diverticulitis. SYMPTOMS   Abdominal pain and tenderness. Usually, the pain is located on the left side of your abdomen. However, it could be located elsewhere.   Fever.   Bloating.   Feeling sick to your stomach (nausea).   Throwing up (vomiting).   Abnormal stools.  DIAGNOSIS  Your caregiver will take a history and perform a physical exam. Since many things can cause abdominal pain, other tests may be necessary. Tests may include:  Blood tests.   Urine tests.   X-ray of the abdomen.   CT scan of the abdomen.  Sometimes, surgery is needed to determine if diverticulitis or other conditions are causing your symptoms. TREATMENT  Most of the time, you can be treated without surgery. Treatment includes:  Resting the bowels by only having liquids for a few days. As you improve, you will need to eat a low-fiber diet.   Intravenous (IV) fluids if you are losing body fluids (dehydrated).   Antibiotic medicines that treat infections may be given.   Pain and  nausea medicine, if needed.   Surgery if the inflamed diverticulum has burst.  HOME CARE INSTRUCTIONS   Try a clear liquid diet (broth, tea, or water for as long as directed by your caregiver). You may then gradually begin a low-fiber diet as tolerated. A low-fiber diet is a diet with less than 10 grams of fiber. Choose the foods below to reduce fiber in the diet:   White breads, cereals, rice, and pasta.   Cooked fruits and vegetables or soft fresh fruits and vegetables without the skin.   Ground or well-cooked tender beef, ham, veal, lamb, pork, or poultry.   Eggs and seafood.   After your diverticulitis symptoms have improved, your caregiver may put you on a high-fiber diet. A high-fiber diet includes 14 grams of fiber for every 1000 calories consumed. For a standard 2000 calorie diet, you would need 28 grams of fiber. Follow these diet guidelines to help you increase the fiber in your diet. It is important to slowly increase the amount fiber in your diet to avoid gas, constipation, and bloating.   Choose whole-grain breads, cereals, pasta, and brown rice.   Choose fresh fruits and vegetables with the skin on. Do not overcook vegetables because the more vegetables are cooked, the more fiber is lost.   Choose more nuts, seeds, legumes, dried peas, beans, and lentils.   Look for food products that have greater than 3 grams of fiber per serving on the Nutrition Facts label.   Take all medicine as directed by your caregiver.   If your caregiver has given you a follow-up appointment, it is very important that you go. Not going could result in lasting (chronic) or permanent injury, pain, and disability. If there is any problem keeping the appointment, call to reschedule.  SEEK MEDICAL CARE IF:   Your pain does not improve.   You have a hard time advancing your diet beyond clear liquids.   Your bowel movements do not return to normal.  SEEK IMMEDIATE MEDICAL CARE IF:   Your pain  becomes worse.   You have an oral temperature above 102 F (38.9 C), not controlled by medicine.   You have repeated vomiting.   You have bloody or black, tarry stools.   Symptoms that brought you to your caregiver become  worse or are not getting better.  MAKE SURE YOU:   Understand these instructions.   Will watch your condition.   Will get help right away if you are not doing well or get worse.  Document Released: 05/10/2005 Document Revised: 07/20/2011 Document Reviewed: 09/05/2010 [facility] Patient Information 2012 [place], [place].

## 2012-02-05 NOTE — H&P (Signed)
  Primary Care Physician:  Carylon Perches, MD Primary Gastroenterologist:  Dr. Jena Gauss  Pre-Procedure History & Physical: HPI:  Erik Murray is a 66 y.o. male here for  is here for average risk screening colonoscopy. Last colonoscopy 10 years ago-negative. No bowel symptoms. Family history of colon polyps or colon cancer.  Past Medical History  Diagnosis Date  . No pertinent past medical history   . Arthritis     Past Surgical History  Procedure Date  . Tonsillectomy   . Colonoscopy   . Trigger finger release 10/10/2011    Procedure: RELEASE TRIGGER FINGER/A-1 PULLEY;  Surgeon: Nicki Reaper, MD;  Location: [facility];  Service: Orthopedics;  Laterality: Right;  right thumb    Prior to Admission medications   Medication Sig Start Date End Date Taking? Authorizing Provider  aspirin EC 81 MG tablet Take 81 mg by mouth daily.   Yes Historical Provider, MD  Multiple Vitamins-Minerals (MULTIVITAMIN WITH MINERALS) tablet Take 1 tablet by mouth daily.   Yes Historical Provider, MD    Allergies as of 12/28/2011  . (No Known Allergies)    Family History  Problem Relation Age of Onset  . Colon cancer Neg Hx     History   Social History  . Marital Status: Married    Spouse Name: N/A    Number of Children: N/A  . Years of Education: N/A   Occupational History  . Not on file.   Social History Main Topics  . Smoking status: Former Smoker -- 1.0 packs/day for 40 years    Types: Cigarettes  . Smokeless tobacco: Former Neurosurgeon    Quit date: 10/03/2005  . Alcohol Use: Yes     occasional  . Drug Use: No  . Sexually Active:    Other Topics Concern  . Not on file   Social History Narrative  . No narrative on file    Review of Systems: See HPI, otherwise negative ROS  Physical Exam: BP 136/84  Pulse 73  Temp 97.7 F (36.5 C) (Oral)  Resp 16  Ht 5\' 10"  (1.778 m)  Wt 225 lb (102.059 kg)  BMI 32.28 kg/m2  SpO2 97% General:   Alert,  Well-developed,  well-nourished, pleasant and cooperative in NAD Skin:  Intact without significant lesions or rashes. Eyes:  Sclera clear, no icterus.   Conjunctiva pink. Ears:  Normal auditory acuity. Nose:  No deformity, discharge,  or lesions. Mouth:  No deformity or lesions. Neck:  Supple; no masses or thyromegaly. No significant cervical adenopathy. Lungs:  Clear throughout to auscultation.   No wheezes, crackles, or rhonchi. No acute distress. Heart:  Regular rate and rhythm; no murmurs, clicks, rubs,  or gallops. Abdomen: Non-distended, normal bowel sounds.  Soft and nontender without appreciable mass or hepatosplenomegaly.  Pulses:  Normal pulses noted. Extremities:  Without clubbing or edema.  Impression/Plan: Pleasant 66 year old gentleman presents for average risk colorectal cancer screening via colonoscopy. The risks, benefits, limitations, alternatives and imponderables have been reviewed with the patient. Questions have been answered. All parties are agreeable.

## 2012-02-06 ENCOUNTER — Encounter: Payer: Self-pay | Admitting: Internal Medicine

## 2012-02-07 ENCOUNTER — Encounter (HOSPITAL_COMMUNITY): Payer: Self-pay | Admitting: Internal Medicine

## 2014-06-30 ENCOUNTER — Other Ambulatory Visit: Payer: Self-pay | Admitting: Urology

## 2014-06-30 ENCOUNTER — Ambulatory Visit (INDEPENDENT_AMBULATORY_CARE_PROVIDER_SITE_OTHER): Payer: Medicare Other | Admitting: Urology

## 2014-06-30 DIAGNOSIS — R972 Elevated prostate specific antigen [PSA]: Secondary | ICD-10-CM

## 2014-07-14 HISTORY — PX: PROSTATE BIOPSY: SHX241

## 2014-07-27 ENCOUNTER — Other Ambulatory Visit: Payer: Self-pay | Admitting: Urology

## 2014-07-27 DIAGNOSIS — R972 Elevated prostate specific antigen [PSA]: Secondary | ICD-10-CM

## 2014-07-28 ENCOUNTER — Ambulatory Visit (HOSPITAL_COMMUNITY)
Admission: RE | Admit: 2014-07-28 | Discharge: 2014-07-28 | Disposition: A | Discharge status: Home / Self Care | Payer: Medicare Other | Source: Ambulatory Visit | Attending: Urology | Admitting: Urology

## 2014-07-28 ENCOUNTER — Encounter (HOSPITAL_COMMUNITY): Payer: Self-pay

## 2014-07-28 VITALS — BP 129/80 | HR 76 | Temp 98.3°F | Resp 16

## 2014-07-28 DIAGNOSIS — R972 Elevated prostate specific antigen [PSA]: Secondary | ICD-10-CM | POA: Insufficient documentation

## 2014-07-28 MED ORDER — GENTAMICIN SULFATE 40 MG/ML IJ SOLN
160.0000 mg | Freq: Once | INTRAMUSCULAR | Status: AC
  Administered 2014-07-28: 160 mg via INTRAMUSCULAR

## 2014-07-28 MED ORDER — LIDOCAINE HCL (PF) 2 % IJ SOLN
INTRAMUSCULAR | Status: AC
  Administered 2014-07-28: 10 mL
  Filled 2014-07-28: qty 10

## 2014-07-28 MED ORDER — GENTAMICIN SULFATE 40 MG/ML IJ SOLN
INTRAMUSCULAR | Status: AC
  Administered 2014-07-28: 160 mg via INTRAMUSCULAR
  Filled 2014-07-28: qty 4

## 2014-07-28 MED ORDER — LIDOCAINE HCL (PF) 2 % IJ SOLN
10.0000 mL | Freq: Once | INTRAMUSCULAR | Status: AC
  Administered 2014-07-28: 10 mL

## 2014-07-28 NOTE — Discharge Instructions (Signed)
Transrectal Ultrasound-Guided Biopsy °A transrectal ultrasound-guided biopsy is a procedure to remove samples of tissue from your prostate using ultrasound images to guide the procedure. The procedure is usually done to evaluate the prostate gland of men who have an elevated prostate-specific antigen (PSA). PSA is a blood test to screen for prostate cancer. The biopsy samples are taken to check for prostate cancer.  °LET YOUR HEALTH CARE PROVIDER KNOW ABOUT: °· Any allergies you have. °· All medicines you are taking, including vitamins, herbs, eye drops, creams, and over-the-counter medicines. °· Previous problems you or members of your family have had with the use of anesthetics. °· Any blood disorders you have. °· Previous surgeries you have had. °· Medical conditions you have. °RISKS AND COMPLICATIONS °Generally, this is a safe procedure. However, as with any procedure, problems can occur. Possible problems include: °· Infection of your prostate. °· Bleeding from your rectum or blood in your urine. °· Difficulty urinating. °· Nerve damage (this is usually temporary). °· Damage to surrounding structures such as blood vessels, organs, and muscles, which would require other procedures. °BEFORE THE PROCEDURE °· Do not eat or drink anything after midnight on the night before the procedure or as directed by your health care provider. °· Take medicines only as directed by your health care provider. °· Your health care provider may have you stop taking certain medicines 5-7 days before the procedure. °· You will be given an enema before the procedure. During an enema, a liquid is injected into your rectum to clear out waste. °· You may have lab tests the day of your procedure.   °· Plan to have someone take you home after the procedure. °PROCEDURE  °· You will be given medicine to help you relax (sedative) before the procedure. An IV tube will be inserted into one of your veins and used to give fluids and  medicine. °· You will be given antibiotic medicine to reduce the risk of an infection. °· You will be placed on your side for the procedure. °· A probe with lubricated gel will be placed into your rectum, and images will be taken of your prostate and surrounding structures. °· Numbing medicine will be injected into the prostate before the biopsy samples are taken. °· A biopsy needle will then be inserted and guided to your prostate with the use of the ultrasound images. °· Samples of prostate tissue will be taken, and the needle will then be removed. °· The biopsy samples will be sent to a lab to be analyzed. Results are usually back in 2-3 days. °AFTER THE PROCEDURE °· You will be taken to a recovery area where you will be monitored. °· You may have some discomfort in the rectal area. You will be given pain medicines to control this. °· You may be allowed to go home the same day, or you may need to stay in the hospital overnight. °Document Released: 12/15/2013 Document Reviewed: 03/19/2013 °[employer]® Patient Information ©2015 [employer]. This information is not intended to replace advice given to you by your health care provider. Make sure you discuss any questions you have with your health care provider. ° °

## 2014-08-14 DIAGNOSIS — C801 Malignant (primary) neoplasm, unspecified: Secondary | ICD-10-CM

## 2014-08-14 HISTORY — DX: Malignant (primary) neoplasm, unspecified: C80.1

## 2014-09-02 ENCOUNTER — Other Ambulatory Visit: Payer: Self-pay | Admitting: Urology

## 2014-09-24 NOTE — Patient Instructions (Addendum)
KAZI REPPOND  09/24/2014   Your procedure is scheduled on: Thursday February 18th, 2016  [facility] and follow signs to               [place] at 900 AM.  Call this number if you have problems the morning of surgery [phone]   Remember: [place].  Do not eat food or drink liquids :After Midnight Tuesday February 16 th, 2016, clear liquids all day Wednesday February 17th, 2016     Take these medicines the morning of surgery with A SIP OF WATER: none                               You may not have any metal on your body including hair pins and              piercings  Do not wear jewelry, make-up, lotions, powders or perfumes.             Do not wear nail polish.  Do not shave  48 hours prior to surgery.              Men may shave face and neck.   Do not bring valuables to the hospital. [place].  Contacts, dentures or bridgework may not be worn into surgery.  Leave suitcase in the car. After surgery it may be brought to your room.     Patients discharged the day of surgery will not be allowed to drive home.  Name and phone number of your driver:  Special Instructions: N/A              Please read over the following fact sheets you were given: _____________________________________________________________________             [facility] - Preparing for Surgery Before surgery, you can play an important role.  Because skin is not sterile, your skin needs to be as free of germs as possible.  You can reduce the number of germs on your skin by washing with CHG (chlorahexidine gluconate) soap before surgery.  CHG is an antiseptic cleaner which kills germs and bonds with the skin to continue killing germs even after washing. Please DO NOT use if you have an allergy to CHG or antibacterial soaps.  If your skin becomes  reddened/irritated stop using the CHG and inform your nurse when you arrive at Short Stay. Do not shave (including legs and underarms) for at least 48 hours prior to the first CHG shower.  You may shave your face/neck. Please follow these instructions carefully:  1.  Shower with CHG Soap the night before surgery and the  morning of Surgery.  2.  If you choose to wash your hair, wash your hair first as usual with your  normal  shampoo.  3.  After you shampoo, rinse your hair and body thoroughly to remove the  shampoo.                           4.  Use CHG as you would any other liquid soap.  You can apply chg directly  to the skin and wash  Gently with a scrungie or clean washcloth.  5.  Apply the CHG Soap to your body ONLY FROM THE NECK DOWN.   Do not use on face/ open                           Wound or open sores. Avoid contact with eyes, ears mouth and genitals (private parts).                       Wash face,  Genitals (private parts) with your normal soap.             6.  Wash thoroughly, paying special attention to the area where your surgery  will be performed.  7.  Thoroughly rinse your body with warm water from the neck down.  8.  DO NOT shower/wash with your normal soap after using and rinsing off  the CHG Soap.                9.  Pat yourself dry with a clean towel.            10.  Wear clean pajamas.            11.  Place clean sheets on your bed the night of your first shower and do not  sleep with pets. Day of Surgery : Do not apply any lotions/deodorants the morning of surgery.  Please wear clean clothes to the hospital/surgery center.  FAILURE TO FOLLOW THESE INSTRUCTIONS MAY RESULT IN THE CANCELLATION OF YOUR SURGERY PATIENT SIGNATURE_________________________________  NURSE SIGNATURE__________________________________  ________________________________________________________________________   Adam Phenix  An incentive spirometer is a tool that  can help keep your lungs clear and active. This tool measures how well you are filling your lungs with each breath. Taking long deep breaths may help reverse or decrease the chance of developing breathing (pulmonary) problems (especially infection) following:  A long period of time when you are unable to move or be active. BEFORE THE PROCEDURE   If the spirometer includes an indicator to show your best effort, your nurse or respiratory therapist will set it to a desired goal.  If possible, sit up straight or lean slightly forward. Try not to slouch.  Hold the incentive spirometer in an upright position. INSTRUCTIONS FOR USE  1. Sit on the edge of your bed if possible, or sit up as far as you can in bed or on a chair. 2. Hold the incentive spirometer in an upright position. 3. Breathe out normally. 4. Place the mouthpiece in your mouth and seal your lips tightly around it. 5. Breathe in slowly and as deeply as possible, raising the piston or the ball toward the top of the column. 6. Hold your breath for 3-5 seconds or for as long as possible. Allow the piston or ball to fall to the bottom of the column. 7. Remove the mouthpiece from your mouth and breathe out normally. 8. Rest for a few seconds and repeat Steps 1 through 7 at least 10 times every 1-2 hours when you are awake. Take your time and take a few normal breaths between deep breaths. 9. The spirometer may include an indicator to show your best effort. Use the indicator as a goal to work toward during each repetition. 10. After each set of 10 deep breaths, practice coughing to be sure your lungs are clear. If you have an incision (the cut made at the time of surgery),  support your incision when coughing by placing a pillow or rolled up towels firmly against it. Once you are able to get out of bed, walk around indoors and cough well. You may stop using the incentive spirometer when instructed by your caregiver.  RISKS AND  COMPLICATIONS  Take your time so you do not get dizzy or light-headed.  If you are in pain, you may need to take or ask for pain medication before doing incentive spirometry. It is harder to take a deep breath if you are having pain. AFTER USE  Rest and breathe slowly and easily.  It can be helpful to keep track of a log of your progress. Your caregiver can provide you with a simple table to help with this. If you are using the spirometer at home, follow these instructions: [place] IF:   You are having difficultly using the spirometer.  You have trouble using the spirometer as often as instructed.  Your pain medication is not giving enough relief while using the spirometer.  You develop fever of 100.5 F (38.1 C) or higher. SEEK IMMEDIATE MEDICAL CARE IF:   You cough up bloody sputum that had not been present before.  You develop fever of 102 F (38.9 C) or greater.  You develop worsening pain at or near the incision site. MAKE SURE YOU:   Understand these instructions.  Will watch your condition.  Will get help right away if you are not doing well or get worse. Document Released: 12/11/2006 Document Revised: 10/23/2011 Document Reviewed: 02/11/2007 [facility] Patient Information 2014 [place], [place].   ________________________________________________________________________

## 2014-09-24 NOTE — Patient Instructions (Signed)
Erik Murray  09/24/2014   Your procedure is scheduled on: 10/01/2014    Report to [facility] Main  Entrance and follow signs to               [place] at     0900 AM.  Call this number if you have problems the morning of surgery [phone]   Remember:  Do not eat food or drink liquids :After Midnight.     Take these medicines the morning of surgery with A SIP OF WATER:                                You may not have any metal on your body including hair pins and              piercings  Do not wear jewelry,  lotions, powders or perfumes., deodorant                         Men may shave face and neck.   Do not bring valuables to the hospital. [place].  Contacts, dentures or bridgework may not be worn into surgery.  Leave suitcase in the car. After surgery it may be brought to your room.      Special Instructions: coughing and deep breathing exercises, leg exercises               Please read over the following fact sheets you were given: _____________________________________________________________________             [facility] - Preparing for Surgery Before surgery, you can play an important role.  Because skin is not sterile, your skin needs to be as free of germs as possible.  You can reduce the number of germs on your skin by washing with CHG (chlorahexidine gluconate) soap before surgery.  CHG is an antiseptic cleaner which kills germs and bonds with the skin to continue killing germs even after washing. Please DO NOT use if you have an allergy to CHG or antibacterial soaps.  If your skin becomes reddened/irritated stop using the CHG and inform your nurse when you arrive at Short Stay. Do not shave (including legs and underarms) for at least 48 hours prior to the first CHG shower.  You may shave your face/neck. Please follow these instructions carefully:  1.  Shower with CHG Soap  the night before surgery and the  morning of Surgery.  2.  If you choose to wash your hair, wash your hair first as usual with your  normal  shampoo.  3.  After you shampoo, rinse your hair and body thoroughly to remove the  shampoo.                           4.  Use CHG as you would any other liquid soap.  You can apply chg directly  to the skin and wash                       Gently with a scrungie or clean washcloth.  5.  Apply the CHG Soap to your body ONLY FROM THE NECK DOWN.   Do  not use on face/ open                           Wound or open sores. Avoid contact with eyes, ears mouth and genitals (private parts).                       Wash face,  Genitals (private parts) with your normal soap.             6.  Wash thoroughly, paying special attention to the area where your surgery  will be performed.  7.  Thoroughly rinse your body with warm water from the neck down.  8.  DO NOT shower/wash with your normal soap after using and rinsing off  the CHG Soap.                9.  Pat yourself dry with a clean towel.            10.  Wear clean pajamas.            11.  Place clean sheets on your bed the night of your first shower and do not  sleep with pets. Day of Surgery : Do not apply any lotions/deodorants the morning of surgery.  Please wear clean clothes to the hospital/surgery center.  FAILURE TO FOLLOW THESE INSTRUCTIONS MAY RESULT IN THE CANCELLATION OF YOUR SURGERY PATIENT SIGNATURE_________________________________  NURSE SIGNATURE__________________________________  ________________________________________________________________________  WHAT IS A BLOOD TRANSFUSION? Blood Transfusion Information  A transfusion is the replacement of blood or some of its parts. Blood is made up of multiple cells which provide different functions.  Red blood cells carry oxygen and are used for blood loss replacement.  White blood cells fight against infection.  Platelets control bleeding.  Plasma  helps clot blood.  Other blood products are available for specialized needs, such as hemophilia or other clotting disorders. BEFORE THE TRANSFUSION  Who gives blood for transfusions?   Healthy volunteers who are fully evaluated to make sure their blood is safe. This is blood bank blood. Transfusion therapy is the safest it has ever been in the practice of medicine. Before blood is taken from a donor, a complete history is taken to make sure that person has no history of diseases nor engages in risky social behavior (examples are intravenous drug use or sexual activity with multiple partners). The donor's travel history is screened to minimize risk of transmitting infections, such as malaria. The donated blood is tested for signs of infectious diseases, such as HIV and hepatitis. The blood is then tested to be sure it is compatible with you in order to minimize the chance of a transfusion reaction. If you or a relative donates blood, this is often done in anticipation of surgery and is not appropriate for emergency situations. It takes many days to process the donated blood. RISKS AND COMPLICATIONS Although transfusion therapy is very safe and saves many lives, the main dangers of transfusion include:  1. Getting an infectious disease. 2. Developing a transfusion reaction. This is an allergic reaction to something in the blood you were given. Every precaution is taken to prevent this. The decision to have a blood transfusion has been considered carefully by your caregiver before blood is given. Blood is not given unless the benefits outweigh the risks. AFTER THE TRANSFUSION  Right after receiving a blood transfusion, you will usually feel much better and more energetic. This is especially true  if your red blood cells have gotten low (anemic). The transfusion raises the level of the red blood cells which carry oxygen, and this usually causes an energy increase.  The nurse administering the transfusion  will monitor you carefully for complications. HOME CARE INSTRUCTIONS  No special instructions are needed after a transfusion. You may find your energy is better. Speak with your caregiver about any limitations on activity for underlying diseases you may have. SEEK MEDICAL CARE IF:   Your condition is not improving after your transfusion.  You develop redness or irritation at the intravenous (IV) site. SEEK IMMEDIATE MEDICAL CARE IF:  Any of the following symptoms occur over the next 12 hours:  Shaking chills.  You have a temperature by mouth above 102 F (38.9 C), not controlled by medicine.  Chest, back, or muscle pain.  People around you feel you are not acting correctly or are confused.  Shortness of breath or difficulty breathing.  Dizziness and fainting.  You get a rash or develop hives.  You have a decrease in urine output.  Your urine turns a dark color or changes to pink, red, or brown. Any of the following symptoms occur over the next 10 days:  You have a temperature by mouth above 102 F (38.9 C), not controlled by medicine.  Shortness of breath.  Weakness after normal activity.  The white part of the eye turns yellow (jaundice).  You have a decrease in the amount of urine or are urinating less often.  Your urine turns a dark color or changes to pink, red, or brown. Document Released: 07/28/2000 Document Revised: 10/23/2011 Document Reviewed: 03/16/2008 ExitCare Patient Information 2014 [place].  _______________________________________________________________________  Incentive Spirometer  An incentive spirometer is a tool that can help keep your lungs clear and active. This tool measures how well you are filling your lungs with each breath. Taking long deep breaths may help reverse or decrease the chance of developing breathing (pulmonary) problems (especially infection) following:  A long period of time when you are unable to move or be  active. BEFORE THE PROCEDURE   If the spirometer includes an indicator to show your best effort, your nurse or respiratory therapist will set it to a desired goal.  If possible, sit up straight or lean slightly forward. Try not to slouch.  Hold the incentive spirometer in an upright position. INSTRUCTIONS FOR USE  3. Sit on the edge of your bed if possible, or sit up as far as you can in bed or on a chair. 4. Hold the incentive spirometer in an upright position. 5. Breathe out normally. 6. Place the mouthpiece in your mouth and seal your lips tightly around it. 7. Breathe in slowly and as deeply as possible, raising the piston or the ball toward the top of the column. 8. Hold your breath for 3-5 seconds or for as long as possible. Allow the piston or ball to fall to the bottom of the column. 9. Remove the mouthpiece from your mouth and breathe out normally. 10. Rest for a few seconds and repeat Steps 1 through 7 at least 10 times every 1-2 hours when you are awake. Take your time and take a few normal breaths between deep breaths. 11. The spirometer may include an indicator to show your best effort. Use the indicator as a goal to work toward during each repetition. 12. After each set of 10 deep breaths, practice coughing to be sure your lungs are clear. If you have an  incision (the cut made at the time of surgery), support your incision when coughing by placing a pillow or rolled up towels firmly against it. Once you are able to get out of bed, walk around indoors and cough well. You may stop using the incentive spirometer when instructed by your caregiver.  RISKS AND COMPLICATIONS  Take your time so you do not get dizzy or light-headed.  If you are in pain, you may need to take or ask for pain medication before doing incentive spirometry. It is harder to take a deep breath if you are having pain. AFTER USE  Rest and breathe slowly and easily.  It can be helpful to keep track of a log of  your progress. Your caregiver can provide you with a simple table to help with this. If you are using the spirometer at home, follow these instructions: [place] IF:   You are having difficultly using the spirometer.  You have trouble using the spirometer as often as instructed.  Your pain medication is not giving enough relief while using the spirometer.  You develop fever of 100.5 F (38.1 C) or higher. SEEK IMMEDIATE MEDICAL CARE IF:   You cough up bloody sputum that had not been present before.  You develop fever of 102 F (38.9 C) or greater.  You develop worsening pain at or near the incision site. MAKE SURE YOU:   Understand these instructions.  Will watch your condition.  Will get help right away if you are not doing well or get worse. Document Released: 12/11/2006 Document Revised: 10/23/2011 Document Reviewed: 02/11/2007 [facility] Patient Information 2014 [place], [place].   ________________________________________________________________________

## 2014-09-25 ENCOUNTER — Encounter (HOSPITAL_COMMUNITY)
Admission: RE | Admit: 2014-09-25 | Discharge: 2014-09-25 | Disposition: A | Discharge status: Home / Self Care | Payer: Medicare Other | Source: Ambulatory Visit | Attending: Urology | Admitting: Urology

## 2014-09-25 ENCOUNTER — Encounter (HOSPITAL_COMMUNITY): Payer: Self-pay

## 2014-09-25 ENCOUNTER — Ambulatory Visit (HOSPITAL_COMMUNITY)
Admission: RE | Admit: 2014-09-25 | Discharge: 2014-09-25 | Disposition: A | Discharge status: Home / Self Care | Payer: Medicare Other | Source: Ambulatory Visit | Attending: Urology | Admitting: Urology

## 2014-09-25 DIAGNOSIS — Z01818 Encounter for other preprocedural examination: Secondary | ICD-10-CM | POA: Diagnosis present

## 2014-09-25 DIAGNOSIS — Z87891 Personal history of nicotine dependence: Secondary | ICD-10-CM | POA: Diagnosis not present

## 2014-09-25 DIAGNOSIS — R972 Elevated prostate specific antigen [PSA]: Secondary | ICD-10-CM | POA: Insufficient documentation

## 2014-09-25 DIAGNOSIS — Z0189 Encounter for other specified special examinations: Secondary | ICD-10-CM

## 2014-09-25 HISTORY — DX: Malignant (primary) neoplasm, unspecified: C80.1

## 2014-09-25 LAB — BASIC METABOLIC PANEL
ANION GAP: 10 (ref 5–15)
BUN: 18 mg/dL (ref 6–23)
CALCIUM: 8.7 mg/dL (ref 8.4–10.5)
CHLORIDE: 104 mmol/L (ref 96–112)
CO2: 25 mmol/L (ref 19–32)
Creatinine, Ser: 1.2 mg/dL (ref 0.50–1.35)
GFR calc Af Amer: 71 mL/min — ABNORMAL LOW (ref >90)
GFR calc non Af Amer: 61 mL/min — ABNORMAL LOW (ref >90)
Glucose, Bld: 104 mg/dL — ABNORMAL HIGH (ref 70–99)
Potassium: 4.5 mmol/L (ref 3.5–5.1)
SODIUM: 139 mmol/L (ref 135–145)

## 2014-09-25 LAB — CBC
HEMATOCRIT: 47.5 % (ref 39.0–52.0)
Hemoglobin: 16 g/dL (ref 13.0–17.0)
MCH: 32 pg (ref 26.0–34.0)
MCHC: 33.7 g/dL (ref 30.0–36.0)
MCV: 95 fL (ref 78.0–100.0)
Platelets: 171 10*3/uL (ref 150–400)
RBC: 5 MIL/uL (ref 4.22–5.81)
RDW: 13.5 % (ref 11.5–15.5)
WBC: 9.4 10*3/uL (ref 4.0–10.5)

## 2014-09-25 LAB — ABO/RH: ABO/RH(D): O POS

## 2014-09-25 NOTE — Progress Notes (Signed)
EKG 12-06-13 DR Asencion Noble ON CHART

## 2014-09-28 ENCOUNTER — Inpatient Hospital Stay (HOSPITAL_COMMUNITY)
Admission: RE | Admit: 2014-09-28 | Discharge: 2014-09-28 | Disposition: A | Discharge status: Home / Self Care | Payer: BC Managed Care – PPO | Source: Ambulatory Visit

## 2014-09-30 NOTE — H&P (Signed)
Chief Complaint Prostate Cancer   Reason For Visit Reason for consult: To discuss treatment options for prostate cancer.  Physician requesting consult: Dr. Franchot Gallo  PCP: Dr. 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 He underwent a prostate needle biopsy by Dr. Diona Fanti for this reason on 07/28/14 that confirmed Gleason 4+3=7 adenocarcinoma with 4 out of 12 biopsy cores positive for malignancy. He has no family history of prostate cancer. He has been well informed about his treatment options from his discussions with Dr. Diona Fanti and is most interested in surgical therapy. Overall, he is exceedingly healthy with no medical comorbid conditions.    TNM stage: cT1c Nx Mx  PSA: 5.7  Gleason score: 4+3=7  Biopsy (07/28/14): 4/12 cores positive    Left: L apex (10%, 3+4=7), L lateral mid (30%, 4+3=7, PNI)    Right: R lateral mid (< 5%, 3+3=6), R base (5%, 3+3=6)  Prostate volume: 27 cc    Nomogram  OC disease: 37%  EPE: 60%  LNI: 8%  SVI: 2%  PFS (surgery): 68% at 5 years, 54% at 10 years    Urinary function: He has minimal lower urinary tract symptoms. IPSS is 2.  Erectile function: He does have erectile dysfunction although admittedly has been sexually inactive. He had his wife do not consider this a major priority. SHIM score is 2.   Past Medical History Problems  1. History of No acute medical problems  Surgical History Problems  1. History of Cataract Surgery 2. History of Hand Surgery  Current Meds 1. Centrum Silver Oral Tablet;  Therapy: (Recorded:17Nov2015) to Recorded  Allergies Medication  1. No Known Drug Allergies  Family History Problems  1. Family history of Death : Mother, Father 2. Family history of congestive heart failure (Z82.49) : Mother 3. Denied: Family history of prostate cancer 4. Family history of  Microscopic hematuria : Father  Social History Problems    Alcohol use (Z78.9)   2-3   Former smoker [phone])   Married   Retired   Tobacco use (Z72.0)   42 years  Review of Systems Constitutional, skin, eye, otolaryngeal, hematologic/lymphatic, cardiovascular, pulmonary, endocrine, musculoskeletal, gastrointestinal, neurological and psychiatric system(s) were reviewed and pertinent findings if present are noted and are otherwise negative.    Vitals Vital Signs [Data Includes: Last 1 Day]  Recorded: 02Feb2016 08:49AM  Height: 5 ft 10 in Blood Pressure: 128 / 72 Temperature: 97.6 F Heart Rate: 72  Physical Exam Constitutional: Well nourished and well developed . No acute distress.  ENT:. The ears and nose are normal in appearance.  Neck: The appearance of the neck is normal and no neck mass is present.  Pulmonary: No respiratory distress, normal respiratory rhythm and effort and clear bilateral breath sounds.  Cardiovascular: Heart rate and rhythm are normal . No peripheral edema.  Abdomen: The abdomen is soft and nontender. No masses are palpated. No CVA tenderness. No hernias are palpable. No hepatosplenomegaly noted.  Rectal: Rectal exam demonstrates normal sphincter tone, no tenderness and no masses. Prostate size is estimated to be 35 g. The prostate has no nodularity and is not tender. The left seminal vesicle is nonpalpable. The right seminal vesicle is nonpalpable. The perineum is normal on inspection.  Genitourinary: Examination of the penis demonstrates no discharge, no masses, no lesions and a normal meatus. The scrotum is without lesions. The right epididymis  is palpably normal and non-tender. The left epididymis is palpably normal and non-tender. The right testis is non-tender and without masses. The left testis is non-tender and without masses.  Lymphatics: The femoral and inguinal nodes are not enlarged or tender.  Skin: Normal skin turgor, no visible rash and  no visible skin lesions.  Neuro/Psych:. Mood and affect are appropriate.    Results/Data Urine [Data Includes: Last 1 Day]   [id number]  COLOR YELLOW   APPEARANCE CLEAR   SPECIFIC GRAVITY 1.025   pH 5.0   GLUCOSE NEG mg/dL  BILIRUBIN NEG   KETONE NEG mg/dL  BLOOD NEG   PROTEIN NEG mg/dL  UROBILINOGEN 0.2 mg/dL  NITRITE NEG   LEUKOCYTE ESTERASE NEG    I have reviewed his medical records, PSA results, and pathology report. Findings are as dictated above.   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 Follow-up Keep Future Appt Office  Follow-up  Status: Hold For - Appointment  Requested  for: [phone] Health Maintenance  2. UA With REFLEX; [Do Not Release]; Status:Complete;   Done: [id number] 08:41AM  Discussion/Summary 1. Prostate cancer: I had a detailed discussion with Erik Murray today regarding his prostate cancer diagnosis and his options for management/treatment.   The patient was counseled about the natural history of prostate cancer and the standard treatment options that are available for prostate cancer. It was explained to him how his age and life expectancy, clinical stage, Gleason score, and PSA affect his prognosis, the decision to proceed with additional staging studies, as well as how that information influences recommended treatment strategies. We discussed the roles for active surveillance, radiation therapy, surgical therapy, androgen deprivation, as well as ablative therapy options for the treatment of prostate cancer as appropriate to his individual cancer situation. We discussed the risks and benefits of these options with regard to their impact on cancer control and also in terms of potential adverse events, complications, and impact on quiality of life particularly related to urinary, bowel, and sexual function. The patient was encouraged to ask questions throughout the discussion today and all questions were  answered to his stated satisfaction. In addition, the patient was provided with and/or directed to appropriate resources and literature for further education about prostate cancer and treatment options.   We discussed surgical therapy for prostate cancer including the different available surgical approaches. We discussed, in detail, the risks and expectations of surgery with regard to cancer control, urinary control, and erectile function as well as the expected postoperative recovery process. Additional risks of surgery including but not limited to bleeding, infection, hernia formation, nerve damage, lymphocele formation, bowel/rectal injury potentially necessitating colostomy, damage to the urinary tract resulting in urine leakage, urethral stricture, and the cardiopulmonary risks such as myocardial infarction, stroke, death, venothromboembolism, etc. were explained. The risk of open surgical conversion for robotic/laparoscopic prostatectomy was also discussed.     He does wish to proceed with surgical therapy. He will undergo a bilateral nerve sparing robotic-assisted laparoscopic radical prostatectomy and pelvic lymphadenectomy.    Cc: Dr. Franchot Gallo  Dr. Asencion Noble  A total of 65 minutes were spent in the overall care of the patient today with 55 minutes in direct face to face consultation.    Signatures Electronically signed by : Erik Murray, M.D.; Sep 15 2014  9:59AM EST

## 2014-10-01 ENCOUNTER — Encounter (HOSPITAL_COMMUNITY): Payer: Self-pay | Admitting: *Deleted

## 2014-10-01 ENCOUNTER — Inpatient Hospital Stay (HOSPITAL_COMMUNITY)
Admission: RE | Admit: 2014-10-01 | Discharge: 2014-10-02 | DRG: 708 | Disposition: A | Discharge status: Home / Self Care | Payer: Medicare Other | Source: Ambulatory Visit | Attending: Urology | Admitting: Urology

## 2014-10-01 ENCOUNTER — Encounter (HOSPITAL_COMMUNITY)
Admission: RE | Disposition: A | Discharge status: Home / Self Care | Payer: Self-pay | Source: Ambulatory Visit | Attending: Urology

## 2014-10-01 ENCOUNTER — Inpatient Hospital Stay (HOSPITAL_COMMUNITY): Payer: Medicare Other | Admitting: Certified Registered Nurse Anesthetist

## 2014-10-01 DIAGNOSIS — C61 Malignant neoplasm of prostate: Principal | ICD-10-CM | POA: Diagnosis present

## 2014-10-01 DIAGNOSIS — N529 Male erectile dysfunction, unspecified: Secondary | ICD-10-CM | POA: Diagnosis present

## 2014-10-01 DIAGNOSIS — Z87891 Personal history of nicotine dependence: Secondary | ICD-10-CM

## 2014-10-01 DIAGNOSIS — F1099 Alcohol use, unspecified with unspecified alcohol-induced disorder: Secondary | ICD-10-CM | POA: Diagnosis not present

## 2014-10-01 HISTORY — PX: ROBOT ASSISTED LAPAROSCOPIC RADICAL PROSTATECTOMY: SHX5141

## 2014-10-01 HISTORY — PX: LYMPHADENECTOMY: SHX5960

## 2014-10-01 LAB — HEMOGLOBIN AND HEMATOCRIT, BLOOD
HCT: 32.4 % — ABNORMAL LOW (ref 39.0–52.0)
Hemoglobin: 10.7 g/dL — ABNORMAL LOW (ref 13.0–17.0)

## 2014-10-01 LAB — TYPE AND SCREEN
ABO/RH(D): O POS
Antibody Screen: NEGATIVE

## 2014-10-01 SURGERY — ROBOTIC ASSISTED LAPAROSCOPIC RADICAL PROSTATECTOMY LEVEL 2
Anesthesia: General

## 2014-10-01 MED ORDER — ACETAMINOPHEN 325 MG PO TABS: 650.0000 mg | ORAL_TABLET | ORAL | Status: DC | PRN

## 2014-10-01 MED ORDER — NEOSTIGMINE METHYLSULFATE 10 MG/10ML IV SOLN
INTRAVENOUS | Status: DC | PRN
  Administered 2014-10-01: 5 mg via INTRAVENOUS

## 2014-10-01 MED ORDER — GLYCOPYRROLATE 0.2 MG/ML IJ SOLN
INTRAMUSCULAR | Status: AC
  Filled 2014-10-01: qty 3

## 2014-10-01 MED ORDER — FENTANYL CITRATE 0.05 MG/ML IJ SOLN
INTRAMUSCULAR | Status: AC
  Filled 2014-10-01: qty 5

## 2014-10-01 MED ORDER — PROPOFOL 10 MG/ML IV BOLUS
INTRAVENOUS | Status: AC
  Filled 2014-10-01: qty 20

## 2014-10-01 MED ORDER — BUPIVACAINE-EPINEPHRINE (PF) 0.25% -1:200000 IJ SOLN
INTRAMUSCULAR | Status: AC
  Filled 2014-10-01: qty 30

## 2014-10-01 MED ORDER — MIDAZOLAM HCL 2 MG/2ML IJ SOLN
INTRAMUSCULAR | Status: AC
  Filled 2014-10-01: qty 2

## 2014-10-01 MED ORDER — CEFAZOLIN SODIUM-DEXTROSE 2-3 GM-% IV SOLR
2.0000 g | INTRAVENOUS | Status: AC
  Administered 2014-10-01: 2 g via INTRAVENOUS

## 2014-10-01 MED ORDER — EPHEDRINE SULFATE 50 MG/ML IJ SOLN
INTRAMUSCULAR | Status: DC | PRN
  Administered 2014-10-01: 10 mg via INTRAVENOUS
  Administered 2014-10-01: 5 mg via INTRAVENOUS

## 2014-10-01 MED ORDER — OXYCODONE HCL 5 MG PO TABS: 5.0000 mg | ORAL_TABLET | Freq: Once | ORAL | Status: DC | PRN

## 2014-10-01 MED ORDER — NEOSTIGMINE METHYLSULFATE 10 MG/10ML IV SOLN
INTRAVENOUS | Status: AC
  Filled 2014-10-01: qty 1

## 2014-10-01 MED ORDER — BUPIVACAINE-EPINEPHRINE 0.25% -1:200000 IJ SOLN
INTRAMUSCULAR | Status: DC | PRN
  Administered 2014-10-01: 30 mL

## 2014-10-01 MED ORDER — HYDROCODONE-ACETAMINOPHEN 5-325 MG PO TABS: 1.0000 | ORAL_TABLET | Freq: Four times a day (QID) | ORAL | Status: DC | PRN

## 2014-10-01 MED ORDER — ALBUMIN HUMAN 5 % IV SOLN
INTRAVENOUS | Status: DC | PRN
  Administered 2014-10-01 (×2): via INTRAVENOUS

## 2014-10-01 MED ORDER — CIPROFLOXACIN HCL 500 MG PO TABS: 500.0000 mg | ORAL_TABLET | Freq: Two times a day (BID) | ORAL | Status: DC

## 2014-10-01 MED ORDER — PHENYLEPHRINE HCL 10 MG/ML IJ SOLN
INTRAMUSCULAR | Status: DC | PRN
  Administered 2014-10-01: 80 ug via INTRAVENOUS
  Administered 2014-10-01: 40 ug via INTRAVENOUS
  Administered 2014-10-01 (×2): 80 ug via INTRAVENOUS
  Administered 2014-10-01 (×2): 40 ug via INTRAVENOUS

## 2014-10-01 MED ORDER — CEFAZOLIN SODIUM-DEXTROSE 2-3 GM-% IV SOLR
INTRAVENOUS | Status: AC
  Filled 2014-10-01: qty 50

## 2014-10-01 MED ORDER — PROMETHAZINE HCL 25 MG/ML IJ SOLN: 6.2500 mg | INTRAMUSCULAR | Status: DC | PRN

## 2014-10-01 MED ORDER — SODIUM CHLORIDE 0.9 % IV BOLUS (SEPSIS)
1000.0000 mL | Freq: Once | INTRAVENOUS | Status: AC
  Administered 2014-10-01: 1000 mL via INTRAVENOUS

## 2014-10-01 MED ORDER — GLYCOPYRROLATE 0.2 MG/ML IJ SOLN
INTRAMUSCULAR | Status: DC | PRN
  Administered 2014-10-01: .8 mg via INTRAVENOUS

## 2014-10-01 MED ORDER — ONDANSETRON HCL 4 MG/2ML IJ SOLN
4.0000 mg | Freq: Four times a day (QID) | INTRAMUSCULAR | Status: DC | PRN
  Administered 2014-10-01: 4 mg via INTRAVENOUS
  Filled 2014-10-01: qty 2

## 2014-10-01 MED ORDER — ONDANSETRON HCL 4 MG/2ML IJ SOLN
INTRAMUSCULAR | Status: AC
  Filled 2014-10-01: qty 2

## 2014-10-01 MED ORDER — DEXAMETHASONE SODIUM PHOSPHATE 10 MG/ML IJ SOLN
INTRAMUSCULAR | Status: DC | PRN
  Administered 2014-10-01: 10 mg via INTRAVENOUS

## 2014-10-01 MED ORDER — LACTATED RINGERS IV SOLN: INTRAVENOUS | Status: DC

## 2014-10-01 MED ORDER — HYDROMORPHONE HCL 2 MG/ML IJ SOLN
INTRAMUSCULAR | Status: AC
  Filled 2014-10-01: qty 1

## 2014-10-01 MED ORDER — OXYCODONE HCL 5 MG/5ML PO SOLN: 5.0000 mg | Freq: Once | ORAL | Status: DC | PRN

## 2014-10-01 MED ORDER — ALBUMIN HUMAN 5 % IV SOLN
INTRAVENOUS | Status: AC
  Filled 2014-10-01: qty 500

## 2014-10-01 MED ORDER — LACTATED RINGERS IV SOLN
INTRAVENOUS | Status: DC | PRN
  Administered 2014-10-01 (×3): via INTRAVENOUS

## 2014-10-01 MED ORDER — SODIUM CHLORIDE 0.9 % IR SOLN
Status: DC | PRN
  Administered 2014-10-01: 1000 mL via INTRAVESICAL

## 2014-10-01 MED ORDER — MORPHINE SULFATE 2 MG/ML IJ SOLN: 2.0000 mg | INTRAMUSCULAR | Status: DC | PRN

## 2014-10-01 MED ORDER — HEPARIN SODIUM (PORCINE) 1000 UNIT/ML IJ SOLN
INTRAMUSCULAR | Status: AC
  Filled 2014-10-01: qty 1

## 2014-10-01 MED ORDER — ONDANSETRON HCL 4 MG/2ML IJ SOLN
INTRAMUSCULAR | Status: DC | PRN
  Administered 2014-10-01: 4 mg via INTRAVENOUS

## 2014-10-01 MED ORDER — KETOROLAC TROMETHAMINE 15 MG/ML IJ SOLN
15.0000 mg | Freq: Four times a day (QID) | INTRAMUSCULAR | Status: DC
  Administered 2014-10-01 – 2014-10-02 (×4): 15 mg via INTRAVENOUS
  Filled 2014-10-01 (×4): qty 1

## 2014-10-01 MED ORDER — HYDROMORPHONE HCL 1 MG/ML IJ SOLN
INTRAMUSCULAR | Status: DC | PRN
  Administered 2014-10-01 (×4): 0.5 mg via INTRAVENOUS

## 2014-10-01 MED ORDER — STERILE WATER FOR IRRIGATION IR SOLN
Status: DC | PRN
  Administered 2014-10-01: 3000 mL

## 2014-10-01 MED ORDER — CEFAZOLIN SODIUM 1-5 GM-% IV SOLN
1.0000 g | Freq: Three times a day (TID) | INTRAVENOUS | Status: AC
  Administered 2014-10-01 – 2014-10-02 (×2): 1 g via INTRAVENOUS
  Filled 2014-10-01 (×2): qty 50

## 2014-10-01 MED ORDER — PROPOFOL 10 MG/ML IV BOLUS
INTRAVENOUS | Status: DC | PRN
  Administered 2014-10-01: 200 mg via INTRAVENOUS

## 2014-10-01 MED ORDER — CISATRACURIUM BESYLATE (PF) 10 MG/5ML IV SOLN
INTRAVENOUS | Status: DC | PRN
  Administered 2014-10-01: 8 mg via INTRAVENOUS
  Administered 2014-10-01: 4 mg via INTRAVENOUS
  Administered 2014-10-01 (×3): 2 mg via INTRAVENOUS
  Administered 2014-10-01: 4 mg via INTRAVENOUS

## 2014-10-01 MED ORDER — PHENYLEPHRINE 40 MCG/ML (10ML) SYRINGE FOR IV PUSH (FOR BLOOD PRESSURE SUPPORT)
PREFILLED_SYRINGE | INTRAVENOUS | Status: AC
  Filled 2014-10-01: qty 20

## 2014-10-01 MED ORDER — DIPHENHYDRAMINE HCL 12.5 MG/5ML PO ELIX: 12.5000 mg | ORAL_SOLUTION | Freq: Four times a day (QID) | ORAL | Status: DC | PRN

## 2014-10-01 MED ORDER — LIDOCAINE HCL (CARDIAC) 20 MG/ML IV SOLN
INTRAVENOUS | Status: DC | PRN
  Administered 2014-10-01: 100 mg via INTRAVENOUS

## 2014-10-01 MED ORDER — SUCCINYLCHOLINE CHLORIDE 20 MG/ML IJ SOLN
INTRAMUSCULAR | Status: DC | PRN
Start: 2014-10-01 — End: 2014-10-01
  Administered 2014-10-01: 130 mg via INTRAVENOUS

## 2014-10-01 MED ORDER — ACETAMINOPHEN 10 MG/ML IV SOLN
1000.0000 mg | Freq: Once | INTRAVENOUS | Status: AC
  Administered 2014-10-01: 1000 mg via INTRAVENOUS
  Filled 2014-10-01: qty 100

## 2014-10-01 MED ORDER — LACTATED RINGERS IV SOLN
INTRAVENOUS | Status: DC | PRN
  Administered 2014-10-01: 1000 mL

## 2014-10-01 MED ORDER — MIDAZOLAM HCL 5 MG/5ML IJ SOLN
INTRAMUSCULAR | Status: DC | PRN
  Administered 2014-10-01: 2 mg via INTRAVENOUS

## 2014-10-01 MED ORDER — LIDOCAINE HCL (CARDIAC) 20 MG/ML IV SOLN
INTRAVENOUS | Status: AC
  Filled 2014-10-01: qty 5

## 2014-10-01 MED ORDER — FENTANYL CITRATE 0.05 MG/ML IJ SOLN
INTRAMUSCULAR | Status: DC | PRN
  Administered 2014-10-01 (×5): 50 ug via INTRAVENOUS

## 2014-10-01 MED ORDER — HYDROMORPHONE HCL 1 MG/ML IJ SOLN: 0.2500 mg | INTRAMUSCULAR | Status: DC | PRN

## 2014-10-01 MED ORDER — DIPHENHYDRAMINE HCL 50 MG/ML IJ SOLN: 12.5000 mg | Freq: Four times a day (QID) | INTRAMUSCULAR | Status: DC | PRN

## 2014-10-01 MED ORDER — KCL IN DEXTROSE-NACL 20-5-0.45 MEQ/L-%-% IV SOLN
INTRAVENOUS | Status: DC
  Administered 2014-10-01: 150 mL/h via INTRAVENOUS
  Administered 2014-10-02 (×2): via INTRAVENOUS
  Filled 2014-10-01 (×5): qty 1000

## 2014-10-01 MED ORDER — DOCUSATE SODIUM 100 MG PO CAPS
100.0000 mg | ORAL_CAPSULE | Freq: Two times a day (BID) | ORAL | Status: DC
  Administered 2014-10-01 – 2014-10-02 (×3): 100 mg via ORAL
  Filled 2014-10-01 (×3): qty 1

## 2014-10-01 SURGICAL SUPPLY — 51 items
CABLE HIGH FREQUENCY MONO STRZ (ELECTRODE) ×4 IMPLANT
CATH FOLEY 2WAY SLVR 18FR 30CC (CATHETERS) ×4 IMPLANT
CATH ROBINSON RED A/P 16FR (CATHETERS) ×4 IMPLANT
CATH ROBINSON RED A/P 8FR (CATHETERS) ×4 IMPLANT
CATH TIEMANN FOLEY 18FR 5CC (CATHETERS) ×4 IMPLANT
CHLORAPREP W/TINT 26ML (MISCELLANEOUS) ×4 IMPLANT
CLIP LIGATING HEM O LOK PURPLE (MISCELLANEOUS) ×16 IMPLANT
CLOTH BEACON ORANGE TIMEOUT ST (SAFETY) ×4 IMPLANT
COVER SURGICAL LIGHT HANDLE (MISCELLANEOUS) ×4 IMPLANT
COVER TIP SHEARS 8 DVNC (MISCELLANEOUS) ×2 IMPLANT
COVER TIP SHEARS 8MM DA VINCI (MISCELLANEOUS) ×2
CUTTER ECHEON FLEX ENDO 45 340 (ENDOMECHANICALS) ×4 IMPLANT
DECANTER SPIKE VIAL GLASS SM (MISCELLANEOUS) ×2 IMPLANT
DRAPE SURG IRRIG POUCH 19X23 (DRAPES) ×4 IMPLANT
DRSG TEGADERM 4X4.75 (GAUZE/BANDAGES/DRESSINGS) ×4 IMPLANT
DRSG TEGADERM 6X8 (GAUZE/BANDAGES/DRESSINGS) ×8 IMPLANT
ELECT REM PT RETURN 9FT ADLT (ELECTROSURGICAL) ×4
ELECTRODE REM PT RTRN 9FT ADLT (ELECTROSURGICAL) ×2 IMPLANT
GLOVE BIO SURGEON STRL SZ 6.5 (GLOVE) ×3 IMPLANT
GLOVE BIO SURGEONS STRL SZ 6.5 (GLOVE) ×1
GLOVE BIOGEL M STRL SZ7.5 (GLOVE) ×14 IMPLANT
GOWN STRL REUS W/TWL LRG LVL3 (GOWN DISPOSABLE) ×16 IMPLANT
HOLDER FOLEY CATH W/STRAP (MISCELLANEOUS) ×4 IMPLANT
IV LACTATED RINGERS 1000ML (IV SOLUTION) ×2 IMPLANT
KIT ACCESSORY DA VINCI DISP (KITS) ×2
KIT ACCESSORY DVNC DISP (KITS) ×2 IMPLANT
LIQUID BAND (GAUZE/BANDAGES/DRESSINGS) ×2 IMPLANT
MANIFOLD NEPTUNE II (INSTRUMENTS) ×4 IMPLANT
NDL SAFETY ECLIPSE 18X1.5 (NEEDLE) ×2 IMPLANT
NEEDLE HYPO 18GX1.5 SHARP (NEEDLE) ×4
PACK ROBOT UROLOGY CUSTOM (CUSTOM PROCEDURE TRAY) ×4 IMPLANT
RELOAD GREEN ECHELON 45 (STAPLE) ×4 IMPLANT
SET TUBE IRRIG SUCTION NO TIP (IRRIGATION / IRRIGATOR) ×4 IMPLANT
SHEET LAVH (DRAPES) ×2 IMPLANT
SOLUTION ELECTROLUBE (MISCELLANEOUS) ×4 IMPLANT
SPONGE LAP 4X18 X RAY DECT (DISPOSABLE) ×2 IMPLANT
SUT ETHILON 3 0 PS 1 (SUTURE) ×4 IMPLANT
SUT MNCRL 3 0 RB1 (SUTURE) ×2 IMPLANT
SUT MNCRL 3 0 VIOLET RB1 (SUTURE) ×2 IMPLANT
SUT MNCRL AB 4-0 PS2 18 (SUTURE) ×8 IMPLANT
SUT MONOCRYL 3 0 RB1 (SUTURE) ×6
SUT VIC AB 0 CT1 27 (SUTURE) ×4
SUT VIC AB 0 CT1 27XBRD ANTBC (SUTURE) ×2 IMPLANT
SUT VIC AB 0 UR5 27 (SUTURE) ×4 IMPLANT
SUT VIC AB 2-0 SH 27 (SUTURE) ×4
SUT VIC AB 2-0 SH 27X BRD (SUTURE) ×2 IMPLANT
SUT VICRYL 0 UR6 27IN ABS (SUTURE) ×8 IMPLANT
SYR 27GX1/2 1ML LL SAFETY (SYRINGE) ×4 IMPLANT
TOWEL OR 17X26 10 PK STRL BLUE (TOWEL DISPOSABLE) ×4 IMPLANT
TOWEL OR NON WOVEN STRL DISP B (DISPOSABLE) ×4 IMPLANT
WATER STERILE IRR 1500ML POUR (IV SOLUTION) ×8 IMPLANT

## 2014-10-01 NOTE — Anesthesia Preprocedure Evaluation (Addendum)
Anesthesia Evaluation  Patient identified by MRN, date of birth, ID band Patient awake    Reviewed: Allergy & Precautions, NPO status , Patient's Chart, lab work & pertinent test results  Airway Mallampati: III  TM Distance: >3 FB Neck ROM: Full    Dental  (+) Teeth Intact, Dental Advisory Given   Pulmonary former smoker,  breath sounds clear to auscultation        Cardiovascular negative cardio ROS  Rhythm:Regular Rate:Normal     Neuro/Psych negative neurological ROS     GI/Hepatic negative GI ROS, Neg liver ROS,   Endo/Other  Morbid obesity  Renal/GU negative Renal ROS     Musculoskeletal  (+) Arthritis -,   Abdominal   Peds  Hematology negative hematology ROS (+)   Anesthesia Other Findings   Reproductive/Obstetrics                            Anesthesia Physical Anesthesia Plan  ASA: II  Anesthesia Plan: General   Post-op Pain Management:    Induction: Intravenous  Airway Management Planned: Oral ETT  Additional Equipment:   Intra-op Plan:   Post-operative Plan: Extubation in OR  Informed Consent: I have reviewed the patients History and Physical, chart, labs and discussed the procedure including the risks, benefits and alternatives for the proposed anesthesia with the patient or authorized representative who has indicated his/her understanding and acceptance.   Dental advisory given  Plan Discussed with: CRNA  Anesthesia Plan Comments:         Anesthesia Quick Evaluation

## 2014-10-01 NOTE — Discharge Instructions (Signed)

## 2014-10-01 NOTE — Progress Notes (Signed)
Patient ID: Erik Murray, male   DOB: October 13, 1945, 69 y.o.   MRN: [id number]  Post-op note  Subjective: The patient is doing well.  No complaints.  Objective: Vital signs in last 24 hours: Temp:  [97.6 F (36.4 C)-98.4 F (36.9 C)] 98.1 F (36.7 C) (02/18 1443) Pulse Rate:  [87-107] 91 (02/18 1443) Resp:  [10-18] 14 (02/18 1443) BP: (111-151)/(57-85) 126/66 mmHg (02/18 1443) SpO2:  [97 %-100 %] 97 % (02/18 1443) Weight:  [104.327 kg (230 lb)] 104.327 kg (230 lb) (02/18 0914)  Intake/Output from previous day:   Intake/Output this shift: Total I/O In: 3335 [P.O.:240; I.V.:2800; IV Piggyback:1550] Out: 975 [Urine:30; Drains:45; Blood:900]  Physical Exam:  General: Alert and oriented. Abdomen: Soft, Nondistended. Incisions: Clean and dry.  Lab Results:  Recent Labs  10/01/14 1422  HGB 10.7*  HCT 32.4*    Assessment/Plan: POD#0   1) Continue to monitor, ambulate, IS   Pryor Curia. MD   LOS: 0 days   Erik Murray,LES 10/01/2014, 6:41 PM

## 2014-10-01 NOTE — Transfer of Care (Signed)
Immediate Anesthesia Transfer of Care Note  Patient: Erik Murray  Procedure(s) Performed: Procedure(s) (LRB): ROBOTIC ASSISTED LAPAROSCOPIC RADICAL PROSTATECTOMY LEVEL 2 (N/A) LYMPHADENECTOMY (Bilateral)  Patient Location: PACU  Anesthesia Type: General  Level of Consciousness: sedated, patient cooperative and responds to stimulation  Airway & Oxygen Therapy: Patient Spontanous Breathing and Patient connected to face mask oxgen  Post-op Assessment: Report given to PACU RN and Post -op Vital signs reviewed and stable  Post vital signs: Reviewed and stable  Complications: No apparent anesthesia complications

## 2014-10-01 NOTE — Interval H&P Note (Signed)
History and Physical Interval Note:  10/01/2014 9:56 AM  Erik Murray  has presented today for surgery, with the diagnosis of PROSTATE CANCER  The various methods of treatment have been discussed with the patient and family. After consideration of risks, benefits and other options for treatment, the patient has consented to  Procedure(s): ROBOTIC ASSISTED LAPAROSCOPIC RADICAL PROSTATECTOMY LEVEL 2 (N/A) LYMPHADENECTOMY (Bilateral) as a surgical intervention .  The patient's history has been reviewed, patient examined, no change in status, stable for surgery.  I have reviewed the patient's chart and labs.  Questions were answered to the patient's satisfaction.     Yolanda Dockendorf,LES

## 2014-10-01 NOTE — Op Note (Signed)

## 2014-10-01 NOTE — Progress Notes (Signed)
Utilization review completed.  

## 2014-10-01 NOTE — Anesthesia Postprocedure Evaluation (Signed)
  Anesthesia Post-op Note  Patient: Erik Murray  Procedure(s) Performed: Procedure(s): ROBOTIC ASSISTED LAPAROSCOPIC RADICAL PROSTATECTOMY LEVEL 2 (N/A) LYMPHADENECTOMY (Bilateral)  Patient Location: PACU  Anesthesia Type:General  Level of Consciousness: awake and alert   Airway and Oxygen Therapy: Patient Spontanous Breathing  Post-op Pain: none  Post-op Assessment: Post-op Vital signs reviewed  Post-op Vital Signs: Reviewed  Last Vitals:  Filed Vitals:   10/01/14 1435  BP:   Pulse: 95  Temp:   Resp: 12    Complications: No apparent anesthesia complications

## 2014-10-01 NOTE — Anesthesia Procedure Notes (Signed)
Procedure Name: Intubation Date/Time: 10/01/2014 10:34 AM Performed by: Montel Clock Pre-anesthesia Checklist: Patient identified, Emergency Drugs available, Suction available, Patient being monitored and Timeout performed Patient Re-evaluated:Patient Re-evaluated prior to inductionOxygen Delivery Method: Circle system utilized Preoxygenation: Pre-oxygenation with 100% oxygen Intubation Type: IV induction Ventilation: Mask ventilation without difficulty and Oral airway inserted - appropriate to patient size Laryngoscope Size: Mac and 3 Grade View: Grade II Tube type: Oral Tube size: 7.5 mm Number of attempts: 1 Airway Equipment and Method: Stylet Placement Confirmation: ETT inserted through vocal cords under direct vision,  positive ETCO2 and breath sounds checked- equal and bilateral Secured at: 23 cm Tube secured with: Tape Dental Injury: Injury to lip  Comments: Small abrasion to lower left lip. Petroleum applied, no bleeding.

## 2014-10-02 ENCOUNTER — Encounter (HOSPITAL_COMMUNITY): Payer: Self-pay | Admitting: Urology

## 2014-10-02 LAB — HEMOGLOBIN AND HEMATOCRIT, BLOOD
HCT: 33.9 % — ABNORMAL LOW (ref 39.0–52.0)
Hemoglobin: 11.4 g/dL — ABNORMAL LOW (ref 13.0–17.0)

## 2014-10-02 MED ORDER — HYDROCODONE-ACETAMINOPHEN 5-325 MG PO TABS
1.0000 | ORAL_TABLET | Freq: Four times a day (QID) | ORAL | Status: DC | PRN
  Administered 2014-10-02: 1 via ORAL
  Filled 2014-10-02: qty 1

## 2014-10-02 MED ORDER — BISACODYL 10 MG RE SUPP
10.0000 mg | Freq: Once | RECTAL | Status: AC
  Administered 2014-10-02: 10 mg via RECTAL
  Filled 2014-10-02: qty 1

## 2014-10-02 NOTE — Care Management Note (Signed)
    Page 1 of 1   10/02/2014     2:24:08 PM CARE MANAGEMENT NOTE 10/02/2014  Patient:  Erik Murray, Erik Murray   Account Number:  [id number]  Date Initiated:  10/02/2014  Documentation initiated by:  Dessa Phi  Subjective/Objective Assessment:   69 y/o m admitted w/Prostate ca.     Action/Plan:   From home.   Anticipated DC Date:  10/02/2014   Anticipated DC Plan:  [place]  CM consult      Choice offered to / List presented to:             Status of service:  Completed, signed off Medicare Important Message given?   (If response is "NO", the following Medicare IM given date fields will be blank) Date Medicare IM given:   Medicare IM given by:   Date Additional Medicare IM given:   Additional Medicare IM given by:    Discharge Disposition:  HOME/SELF CARE  Per UR Regulation:  Reviewed for med. necessity/level of care/duration of stay  If discussed at [place] of Stay Meetings, dates discussed:    Comments:  10/02/14 Dessa Phi RN BSN NCM [phone] No d/c needs or orders.

## 2014-10-02 NOTE — Discharge Summary (Signed)
  Date of admission: 10/01/2014  Date of discharge: 10/02/2014  Admission diagnosis: Prostate Cancer  Discharge diagnosis: Prostate Cancer  History and Physical: For full details, please see admission history and physical. Briefly, Erik Murray is a 69 y.o. gentleman with localized prostate cancer.  After discussing management/treatment options, he elected to proceed with surgical treatment.  Hospital Course: [name] was taken to the operating room on 10/01/2014 and underwent a robotic assisted laparoscopic radical prostatectomy. He tolerated this procedure well and without complications. Postoperatively, he was able to be transferred to a regular hospital room following recovery from anesthesia.  He was able to begin ambulating the night of surgery. He remained hemodynamically stable overnight.  He had excellent urine output with appropriately minimal output from his pelvic drain and his pelvic drain was removed on POD #1.  He was transitioned to oral pain medication, tolerated a clear liquid diet, and had met all discharge criteria and was able to be discharged home later on POD#1.  Laboratory values:  Recent Labs  10/01/14 1422 10/02/14 0535  HGB 10.7* 11.4*  HCT 32.4* 33.9*    Disposition: Home  Discharge instruction: He was instructed to be ambulatory but to refrain from heavy lifting, strenuous activity, or driving. He was instructed on urethral catheter care.  Discharge medications:     Medication List    STOP taking these medications        multivitamin with minerals tablet      TAKE these medications        ciprofloxacin 500 MG tablet  Commonly known as:  CIPRO  Take 1 tablet (500 mg total) by mouth 2 (two) times daily. Start day prior to office visit for foley removal     HYDROcodone-acetaminophen 5-325 MG per tablet  Commonly known as:  NORCO  Take 1-2 tablets by mouth every 6 (six) hours as needed.        Followup: He will followup in 1 week  for catheter removal and to discuss his surgical pathology results.

## 2014-10-02 NOTE — Progress Notes (Signed)
Patient ID: Erik Murray, male   DOB: 10-Feb-1946, 69 y.o.   MRN: [id number]  1 Day Post-Op Subjective: The patient is doing well.  No nausea or vomiting. Pain is adequately controlled.  Objective: Vital signs in last 24 hours: Temp:  [97.6 F (36.4 C)-98.7 F (37.1 C)] 98 F (36.7 C) (02/19 0733) Pulse Rate:  [80-107] 80 (02/19 0733) Resp:  [10-18] 16 (02/19 0733) BP: (111-151)/(57-85) 118/64 mmHg (02/19 0733) SpO2:  [96 %-100 %] 96 % (02/19 0733) Weight:  [104.327 kg (230 lb)] 104.327 kg (230 lb) (02/18 0914)  Intake/Output from previous day: 02/18 0701 - 02/19 0700 In: 5340 [P.O.:240; I.V.:3550; IV Piggyback:1550] Out: 2720 [Urine:1730; Drains:90; Blood:900] Intake/Output this shift:    Physical Exam:  General: Alert and oriented. CV: RRR Lungs: Clear bilaterally. GI: Soft, Nondistended. Incisions: Clean, dry, and intact Urine: Clear Extremities: Nontender, no erythema, no edema.  Lab Results:  Recent Labs  10/01/14 1422 10/02/14 0535  HGB 10.7* 11.4*  HCT 32.4* 33.9*      Assessment/Plan: POD# 1 s/p robotic prostatectomy.  1) SL IVF 2) Ambulate, Incentive spirometry 3) Transition to oral pain medication 4) Dulcolax suppository 5) D/C pelvic drain 6) Plan for likely discharge later today   Pryor Curia. MD   LOS: 1 day   Erik Murray,Erik Murray 10/02/2014, 7:51 AM

## 2014-12-14 ENCOUNTER — Encounter: Payer: Self-pay | Admitting: Internal Medicine

## 2015-01-06 ENCOUNTER — Ambulatory Visit (INDEPENDENT_AMBULATORY_CARE_PROVIDER_SITE_OTHER): Payer: Medicare Other | Admitting: Gastroenterology

## 2015-01-06 ENCOUNTER — Other Ambulatory Visit: Payer: Self-pay

## 2015-01-06 ENCOUNTER — Encounter (INDEPENDENT_AMBULATORY_CARE_PROVIDER_SITE_OTHER): Payer: Self-pay

## 2015-01-06 ENCOUNTER — Encounter: Payer: Self-pay | Admitting: Gastroenterology

## 2015-01-06 VITALS — BP 141/73 | HR 88 | Temp 97.4°F | Ht 70.0 in | Wt 234.0 lb

## 2015-01-06 DIAGNOSIS — Z86010 Personal history of colonic polyps: Secondary | ICD-10-CM | POA: Diagnosis not present

## 2015-01-06 MED ORDER — PEG-KCL-NACL-NASULF-NA ASC-C 100 G PO SOLR: 1.0000 | ORAL | Status: DC

## 2015-01-06 NOTE — Progress Notes (Signed)
Primary Care Physician:  Asencion Noble, MD  Primary Gastroenterologist:  Garfield Cornea, MD   Chief Complaint  Patient presents with  . eval for repeat TCS 3yr follow up    HPI:  Erik Murray is a 69 y.o. male here to schedule three-year surveillance colonoscopy. He had multiple colonic polyps removed, one at the cecum was a sessile serrated adenoma without high-grade dysplasia. Tubular adenoma from the descending colon. Other polyps hyperplastic. No family history of colon cancer. He is doing well. Denies any constipation, diarrhea, melanoma, rectal bleeding, abdominal pain, heartburn, dysphagia, vomiting, weight loss.  He underwent robotic prostatectomy for prostate cancer back in February. Completely recovered. Did not require adjuvant therapy.  Current Outpatient Prescriptions  Medication Sig Dispense Refill  . Multiple Vitamins-Minerals (MULTIVITAMIN ADULT PO) Take 1 tablet by mouth daily.     No current facility-administered medications for this visit.    Allergies as of 01/06/2015  . (No Known Allergies)    Past Medical History  Diagnosis Date  . Arthritis   . Cancer     prostate  . Hx of adenomatous colonic polyps     Past Surgical History  Procedure Laterality Date  . Trigger finger release  10/10/2011    Procedure: RELEASE TRIGGER FINGER/A-1 PULLEY;  Surgeon: Wynonia Sours, MD;  Location: [place];  Service: Orthopedics;  Laterality: Right;  right thumb  . Colonoscopy  02/05/2012    QIH:KVQQVZD and rectal polyps/few diverticulosis (adenomas). next TCS 01/2015  . Prostate biopsy  dec 2015  . Tonsillectomy  as child  . Eye surgery Bilateral apirl, may 2013    cataract lens replacement  . Robot assisted laparoscopic radical prostatectomy N/A 10/01/2014    Procedure: ROBOTIC ASSISTED LAPAROSCOPIC RADICAL PROSTATECTOMY LEVEL 2;  Surgeon: Raynelle Bring, MD;  Location: [facility];  Service: Urology;  Laterality: N/A;  . Lymphadenectomy Bilateral 10/01/2014   Procedure: LYMPHADENECTOMY;  Surgeon: Raynelle Bring, MD;  Location: [facility];  Service: Urology;  Laterality: Bilateral;    Family History  Problem Relation Age of Onset  . Colon cancer Neg Hx   . Diabetes Mother   . Heart disease Mother   . Other Father     age 68, deceased form of lupus    History   Social History  . Marital Status: Married    Spouse Name: N/A  . Number of Children: 2  . Years of Education: N/A   Occupational History  . Not on file.   Social History Main Topics  . Smoking status: Former Smoker -- 1.00 packs/day for 40 years    Types: Cigarettes  . Smokeless tobacco: Former Systems developer    Quit date: 10/03/2005  . Alcohol Use: Yes     Comment: occasional  . Drug Use: No  . Sexual Activity: Not on file   Other Topics Concern  . Not on file   Social History Narrative      ROS:  General: Negative for anorexia, weight loss, fever, chills, fatigue, weakness. Eyes: Negative for vision changes.  ENT: Negative for hoarseness, difficulty swallowing , nasal congestion. CV: Negative for chest pain, angina, palpitations, dyspnea on exertion, peripheral edema.  Respiratory: Negative for dyspnea at rest, dyspnea on exertion, cough, sputum, wheezing.  GI: See history of present illness. GU:  Negative for dysuria, hematuria, urinary incontinence, urinary frequency, nocturnal urination.  MS: Negative for joint pain, low back pain.  Derm: Negative for rash or itching.  Neuro: Negative for weakness, abnormal sensation, seizure, frequent headaches, memory  loss, confusion.  Psych: Negative for anxiety, depression, suicidal ideation, hallucinations.  Endo: Negative for unusual weight change.  Heme: Negative for bruising or bleeding. Allergy: Negative for rash or hives.    Physical Examination:  BP 141/73 mmHg  Pulse 88  Temp(Src) 97.4 F (36.3 C)  Ht 5\' 10"  (1.778 m)  Wt 234 lb (106.142 kg)  BMI 33.58 kg/m2   General: Well-nourished, well-developed in no acute  distress.  Head: Normocephalic, atraumatic.   Eyes: Conjunctiva pink, no icterus. Mouth: Oropharyngeal mucosa moist and pink , no lesions erythema or exudate. Neck: Supple without thyromegaly, masses, or lymphadenopathy.  Lungs: Clear to auscultation bilaterally.  Heart: Regular rate and rhythm, no murmurs rubs or gallops.  Abdomen: Bowel sounds are normal, nontender, nondistended, no hepatosplenomegaly or masses, no abdominal bruits or    hernia , no rebound or guarding.   Rectal: Deferred Extremities: No lower extremity edema. No clubbing or deformities.  Neuro: Alert and oriented x 4 , grossly normal neurologically.  Skin: Warm and dry, no rash or jaundice.   Psych: Alert and cooperative, normal mood and affect.    Imaging Studies: No results found.

## 2015-01-06 NOTE — Patient Instructions (Signed)
1. Colonoscopy as scheduled. See separate instructions.  

## 2015-01-06 NOTE — Assessment & Plan Note (Signed)
Due for three-year surveillance colonoscopy. Clinically doing well at this time.  I have discussed the risks, alternatives, benefits with regards to but not limited to the risk of reaction to medication, bleeding, infection, perforation and the patient is agreeable to proceed. Written consent to be obtained.

## 2015-01-06 NOTE — Progress Notes (Signed)
cc'ed to pcp °

## 2015-02-10 ENCOUNTER — Encounter (HOSPITAL_COMMUNITY)
Admission: RE | Disposition: A | Discharge status: Home / Self Care | Payer: Self-pay | Source: Ambulatory Visit | Attending: Internal Medicine

## 2015-02-10 ENCOUNTER — Ambulatory Visit (HOSPITAL_COMMUNITY)
Admission: RE | Admit: 2015-02-10 | Discharge: 2015-02-10 | Disposition: A | Discharge status: Home / Self Care | Payer: Medicare Other | Source: Ambulatory Visit | Attending: Internal Medicine | Admitting: Internal Medicine

## 2015-02-10 DIAGNOSIS — Z09 Encounter for follow-up examination after completed treatment for conditions other than malignant neoplasm: Secondary | ICD-10-CM | POA: Diagnosis present

## 2015-02-10 DIAGNOSIS — M1990 Unspecified osteoarthritis, unspecified site: Secondary | ICD-10-CM | POA: Diagnosis not present

## 2015-02-10 DIAGNOSIS — D122 Benign neoplasm of ascending colon: Secondary | ICD-10-CM | POA: Insufficient documentation

## 2015-02-10 DIAGNOSIS — Z87891 Personal history of nicotine dependence: Secondary | ICD-10-CM | POA: Insufficient documentation

## 2015-02-10 DIAGNOSIS — Z79899 Other long term (current) drug therapy: Secondary | ICD-10-CM | POA: Insufficient documentation

## 2015-02-10 DIAGNOSIS — Z9079 Acquired absence of other genital organ(s): Secondary | ICD-10-CM | POA: Insufficient documentation

## 2015-02-10 DIAGNOSIS — K388 Other specified diseases of appendix: Secondary | ICD-10-CM | POA: Insufficient documentation

## 2015-02-10 DIAGNOSIS — Z8546 Personal history of malignant neoplasm of prostate: Secondary | ICD-10-CM | POA: Diagnosis not present

## 2015-02-10 DIAGNOSIS — Z860100 Personal history of colon polyps, unspecified: Secondary | ICD-10-CM | POA: Insufficient documentation

## 2015-02-10 DIAGNOSIS — K5730 Diverticulosis of large intestine without perforation or abscess without bleeding: Secondary | ICD-10-CM | POA: Insufficient documentation

## 2015-02-10 HISTORY — PX: COLONOSCOPY: SHX5424

## 2015-02-10 SURGERY — COLONOSCOPY
Anesthesia: Moderate Sedation

## 2015-02-10 MED ORDER — ONDANSETRON HCL 4 MG/2ML IJ SOLN
INTRAMUSCULAR | Status: AC
  Filled 2015-02-10: qty 2

## 2015-02-10 MED ORDER — ONDANSETRON HCL 4 MG/2ML IJ SOLN
INTRAMUSCULAR | Status: DC | PRN
  Administered 2015-02-10: 4 mg via INTRAVENOUS

## 2015-02-10 MED ORDER — SODIUM CHLORIDE 0.9 % IV SOLN
INTRAVENOUS | Status: DC
  Administered 2015-02-10: 1000 mL via INTRAVENOUS

## 2015-02-10 MED ORDER — MIDAZOLAM HCL 5 MG/5ML IJ SOLN
INTRAMUSCULAR | Status: DC | PRN
  Administered 2015-02-10 (×3): 1 mg via INTRAVENOUS
  Administered 2015-02-10: 2 mg via INTRAVENOUS

## 2015-02-10 MED ORDER — MEPERIDINE HCL 100 MG/ML IJ SOLN
INTRAMUSCULAR | Status: DC | PRN
  Administered 2015-02-10: 25 mg via INTRAVENOUS
  Administered 2015-02-10: 50 mg via INTRAVENOUS

## 2015-02-10 MED ORDER — MIDAZOLAM HCL 5 MG/5ML IJ SOLN
INTRAMUSCULAR | Status: AC
  Filled 2015-02-10: qty 10

## 2015-02-10 MED ORDER — MEPERIDINE HCL 100 MG/ML IJ SOLN
INTRAMUSCULAR | Status: AC
  Filled 2015-02-10: qty 2

## 2015-02-10 MED ORDER — STERILE WATER FOR IRRIGATION IR SOLN
Status: DC | PRN
  Administered 2015-02-10: 10:00:00

## 2015-02-10 NOTE — Discharge Instructions (Signed)
°  Colonoscopy Discharge Instructions  Read the instructions outlined below and refer to this sheet in the next few weeks. These discharge instructions provide you with general information on caring for yourself after you leave the hospital. Your doctor may also give you specific instructions. While your treatment has been planned according to the most current medical practices available, unavoidable complications occasionally occur. If you have any problems or questions after discharge, call Dr. Gala Romney at [phone]. ACTIVITY  You may resume your regular activity, but move at a slower pace for the next 24 hours.   Take frequent rest periods for the next 24 hours.   Walking will help get rid of the air and reduce the bloated feeling in your belly (abdomen).   No driving for 24 hours (because of the medicine (anesthesia) used during the test).    Do not sign any important legal documents or operate any machinery for 24 hours (because of the anesthesia used during the test).  NUTRITION  Drink plenty of fluids.   You may resume your normal diet as instructed by your doctor.   Begin with a light meal and progress to your normal diet. Heavy or fried foods are harder to digest and may make you feel sick to your stomach (nauseated).   Avoid alcoholic beverages for 24 hours or as instructed.  MEDICATIONS  You may resume your normal medications unless your doctor tells you otherwise.  WHAT YOU CAN EXPECT TODAY  Some feelings of bloating in the abdomen.   Passage of more gas than usual.   Spotting of blood in your stool or on the toilet paper.  IF YOU HAD POLYPS REMOVED DURING THE COLONOSCOPY:  No aspirin products for 7 days or as instructed.   No alcohol for 7 days or as instructed.   Eat a soft diet for the next 24 hours.  FINDING OUT THE RESULTS OF YOUR TEST Not all test results are available during your visit. If your test results are not back during the visit, make an appointment  with your caregiver to find out the results. Do not assume everything is normal if you have not heard from your caregiver or the medical facility. It is important for you to follow up on all of your test results.  SEEK IMMEDIATE MEDICAL ATTENTION IF:  You have more than a spotting of blood in your stool.   Your belly is swollen (abdominal distention).   You are nauseated or vomiting.   You have a temperature over 101.   You have abdominal pain or discomfort that is severe or gets worse throughout the day.    Polyp information provided  No aspirin or non-steroidal drugs like Advil or Aleve 10 days  Further recommendations to follow pending review of the pathology report

## 2015-02-10 NOTE — Op Note (Signed)
[facility] [address] [place], [address]   1COLONOSCOPY PROCEDURE REPORT  PATIENT: Erik Murray, Erik Murray  MR#: [id number] BIRTHDATE: 05/06/1946 , 69  yrs. old GENDER: male ENDOSCOPIST: R.  Garfield Cornea, MD FACP [facility] REFERRED BY:Roy Willey Blade, M.D. PROCEDURE DATE:  17-Feb-2015 PROCEDURE:   Colonoscopy with snare polypectomy INDICATIONS:History of colonic adenoma. MEDICATIONS: Versed 5 mg IV and Demerol 75 mg IV in divided doses. Zofran 4 mg IV. ASA CLASS:       Class II  CONSENT: The risks, benefits, alternatives and imponderables including but not limited to bleeding, perforation as well as the possibility of a missed lesion have been reviewed.  The potential for biopsy, lesion removal, etc. have also been discussed. Questions have been answered.  All parties agreeable.  Please see the history and physical in the medical record for more information.  DESCRIPTION OF PROCEDURE:   After the risks benefits and alternatives of the procedure were thoroughly explained, informed consent was obtained.  The digital rectal exam revealed no abnormalities of the rectum.   The EC-[id number] ([id number])  endoscope was introduced through the anus and advanced to the cecum, which was identified by both the appendix and ileocecal valve. No adverse events experienced.   The quality of the prep was excellent.  The instrument was then slowly withdrawn as the colon was fully examined. Estimated blood loss is zero unless otherwise noted in this procedure report.      COLON FINDINGS: Normal-appearing rectal mucosa.  Scattered left-sided diverticula; (1) 7 mm pedunculated polyp -  mid ascending segment.  Patient also had clustering of hyperplastic appearing polyps on folds arising out of the appendiceal orifice. The largest being proximally 7 mm in dimensions. The ascending colon polyp was  hot snare removed.  The 3 polyps at the appendiceal orifice were also hot snare removed.  The 7  mm polyp was removed with 2 passes of hot snare cautery.  Retroflexion was performed. .  Withdrawal time=25 minutes 0 seconds.  The scope was withdrawn and the procedure completed. COMPLICATIONS: There were no immediate complications.  ENDOSCOPIC IMPRESSION:  Diverticulosis. Colonic polyps?"multiple?"removed as described above.  RECOMMENDATIONS: No aspirin or arthritis medications -   10 days. Follow-up on pathology.  eSigned:  R. Garfield Cornea, MD Rosalita Chessman [facility] 02-17-15 11:08 AM   cc:  CPT CODES: ICD CODES:  The ICD and CPT codes recommended by this software are interpretations from the data that the clinical staff has captured with the software.  The verification of the translation of this report to the ICD and CPT codes and modifiers is the sole responsibility of the health care institution and practicing physician where this report was generated.  [facility] will not be held responsible for the validity of the ICD and CPT codes included on this report.  AMA assumes no liability for data contained or not contained herein. CPT is a Designer, television/film set of the [employer].  PATIENT NAME:  Erik Murray, Erik Murray MR#: [id number]

## 2015-02-10 NOTE — H&P (Signed)
Expand All Collapse All   Primary Care Physician: Asencion Noble, MD  Primary Gastroenterologist: Garfield Cornea, MD   Chief Complaint  Patient presents with  . eval for repeat TCS 47yr follow up    HPI: Erik Murray is a 69 y.o. male here for surveillance colonoscopy. Seen in the office on May 25.Marland Kitchen He had multiple colonic polyps removed, one at the cecum was a sessile serrated adenoma without high-grade dysplasia. Tubular adenoma from the descending colon. Other polyps hyperplastic. No family history of colon cancer. He is doing well. Denies any constipation, diarrhea, melanoma, rectal bleeding, abdominal pain, heartburn, dysphagia, vomiting, weight loss.  He underwent robotic prostatectomy for prostate cancer back in February. Completely recovered. Did not require adjuvant therapy.  Current Outpatient Prescriptions  Medication Sig Dispense Refill  . Multiple Vitamins-Minerals (MULTIVITAMIN ADULT PO) Take 1 tablet by mouth daily.     No current facility-administered medications for this visit.    Allergies as of 01/06/2015  . (No Known Allergies)    Past Medical History  Diagnosis Date  . Arthritis   . Cancer     prostate  . Hx of adenomatous colonic polyps     Past Surgical History  Procedure Laterality Date  . Trigger finger release  10/10/2011    Procedure: RELEASE TRIGGER FINGER/A-1 PULLEY; Surgeon: Wynonia Sours, MD; Location: [place]; Service: Orthopedics; Laterality: Right; right thumb  . Colonoscopy  02/05/2012    VHQ:IONGEXB and rectal polyps/few diverticulosis (adenomas). next TCS 01/2015  . Prostate biopsy  dec 2015  . Tonsillectomy  as child  . Eye surgery Bilateral apirl, may 2013    cataract lens replacement  . Robot assisted laparoscopic radical prostatectomy N/A 10/01/2014    Procedure: ROBOTIC ASSISTED LAPAROSCOPIC RADICAL PROSTATECTOMY LEVEL 2;  Surgeon: Raynelle Bring, MD; Location: [facility]; Service: Urology; Laterality: N/A;  . Lymphadenectomy Bilateral 10/01/2014    Procedure: LYMPHADENECTOMY; Surgeon: Raynelle Bring, MD; Location: [facility]; Service: Urology; Laterality: Bilateral;    Family History  Problem Relation Age of Onset  . Colon cancer Neg Hx   . Diabetes Mother   . Heart disease Mother   . Other Father     age 59, deceased form of lupus    History   Social History  . Marital Status: Married    Spouse Name: N/A  . Number of Children: 2  . Years of Education: N/A   Occupational History  . Not on file.   Social History Main Topics  . Smoking status: Former Smoker -- 1.00 packs/day for 40 years    Types: Cigarettes  . Smokeless tobacco: Former Systems developer    Quit date: 10/03/2005  . Alcohol Use: Yes     Comment: occasional  . Drug Use: No  . Sexual Activity: Not on file   Other Topics Concern  . Not on file   Social History Narrative     ROS:  General: Negative for anorexia, weight loss, fever, chills, fatigue, weakness. Eyes: Negative for vision changes.  ENT: Negative for hoarseness, difficulty swallowing , nasal congestion. CV: Negative for chest pain, angina, palpitations, dyspnea on exertion, peripheral edema.  Respiratory: Negative for dyspnea at rest, dyspnea on exertion, cough, sputum, wheezing.  GI: See history of present illness. GU: Negative for dysuria, hematuria, urinary incontinence, urinary frequency, nocturnal urination.  MS: Negative for joint pain, low back pain.  Derm: Negative for rash or itching.  Neuro: Negative for weakness, abnormal sensation, seizure, frequent headaches, memory loss, confusion.  Psych: Negative  for anxiety, depression, suicidal ideation, hallucinations.  Endo: Negative for unusual weight change.  Heme: Negative for bruising or bleeding. Allergy:  Negative for rash or hives.   General: Well-nourished, well-developed in no acute distress.  Head: Normocephalic, atraumatic.  Eyes: Conjunctiva pink, no icterus. Mouth: Oropharyngeal mucosa moist and pink , no lesions erythema or exudate. Neck: Supple without thyromegaly, masses, or lymphadenopathy.  Lungs: Clear to auscultation bilaterally.  Heart: Regular rate and rhythm, no murmurs rubs or gallops.  Abdomen: Bowel sounds are normal, nontender, nondistended, no hepatosplenomegaly or masses, no abdominal bruits or hernia , no rebound or guarding.  Rectal: Deferred Extremities: No lower extremity edema. No clubbing or deformities.  Neuro: Alert and oriented x 4 , grossly normal neurologically.  Skin: Warm and dry, no rash or jaundice.  Psych: Alert and cooperative, normal mood and affect.   Impression:  Due for three-year surveillance colonoscopy. Clinically doing well at this time. I have discussed the risks, alternatives, benefits with regards to but not limited to the risk of reaction to medication, bleeding, infection, perforation and the patient is agreeable to proceed. Written consent to be obtained.   Imaging Studies:  Imaging Results    No results found.                 Hx of adenomatous colonic polyps - Mahala Menghini, PA-C at 01/06/2015 2:43 PM     Status: Written Related Problem: Hx of adenomatous colonic polyps   Expand All Collapse All   Due for three-year surveillance colonoscopy. Clinically doing well at this time. I have discussed the risks, alternatives, benefits with regards to but not limited to the risk of reaction to medication, bleeding, infection, perforation and the patient is agreeable to proceed. Written consent to be obtained.

## 2015-02-12 ENCOUNTER — Encounter (HOSPITAL_COMMUNITY): Payer: Self-pay | Admitting: Internal Medicine

## 2015-02-17 ENCOUNTER — Encounter: Payer: Self-pay | Admitting: Internal Medicine

## 2015-05-03 ENCOUNTER — Ambulatory Visit (HOSPITAL_COMMUNITY)
Admission: RE | Admit: 2015-05-03 | Discharge: 2015-05-03 | Disposition: A | Discharge status: Home / Self Care | Payer: Medicare Other | Source: Ambulatory Visit | Attending: Internal Medicine | Admitting: Internal Medicine

## 2015-05-03 ENCOUNTER — Other Ambulatory Visit (HOSPITAL_COMMUNITY): Payer: Self-pay | Admitting: Internal Medicine

## 2015-05-03 DIAGNOSIS — M79672 Pain in left foot: Secondary | ICD-10-CM | POA: Diagnosis not present

## 2015-05-03 DIAGNOSIS — R52 Pain, unspecified: Secondary | ICD-10-CM

## 2016-05-02 ENCOUNTER — Other Ambulatory Visit: Payer: Self-pay | Admitting: Orthopedic Surgery

## 2016-05-17 ENCOUNTER — Encounter (HOSPITAL_BASED_OUTPATIENT_CLINIC_OR_DEPARTMENT_OTHER): Payer: Self-pay | Admitting: *Deleted

## 2016-05-23 ENCOUNTER — Ambulatory Visit (HOSPITAL_BASED_OUTPATIENT_CLINIC_OR_DEPARTMENT_OTHER)
Admission: RE | Admit: 2016-05-23 | Discharge: 2016-05-23 | Disposition: A | Discharge status: Home / Self Care | Payer: Medicare Other | Source: Ambulatory Visit | Attending: Orthopedic Surgery | Admitting: Orthopedic Surgery

## 2016-05-23 ENCOUNTER — Encounter (HOSPITAL_BASED_OUTPATIENT_CLINIC_OR_DEPARTMENT_OTHER)
Admission: RE | Disposition: A | Discharge status: Home / Self Care | Payer: Self-pay | Source: Ambulatory Visit | Attending: Orthopedic Surgery

## 2016-05-23 ENCOUNTER — Ambulatory Visit (HOSPITAL_BASED_OUTPATIENT_CLINIC_OR_DEPARTMENT_OTHER): Payer: Medicare Other | Admitting: Anesthesiology

## 2016-05-23 ENCOUNTER — Encounter (HOSPITAL_BASED_OUTPATIENT_CLINIC_OR_DEPARTMENT_OTHER): Payer: Self-pay | Admitting: Anesthesiology

## 2016-05-23 DIAGNOSIS — M65841 Other synovitis and tenosynovitis, right hand: Secondary | ICD-10-CM | POA: Insufficient documentation

## 2016-05-23 DIAGNOSIS — Z87891 Personal history of nicotine dependence: Secondary | ICD-10-CM | POA: Diagnosis not present

## 2016-05-23 DIAGNOSIS — M65321 Trigger finger, right index finger: Secondary | ICD-10-CM | POA: Insufficient documentation

## 2016-05-23 DIAGNOSIS — Z8546 Personal history of malignant neoplasm of prostate: Secondary | ICD-10-CM | POA: Insufficient documentation

## 2016-05-23 HISTORY — PX: TRIGGER FINGER RELEASE: SHX641

## 2016-05-23 SURGERY — RELEASE, A1 PULLEY, FOR TRIGGER FINGER
Anesthesia: Monitor Anesthesia Care | Site: Finger | Laterality: Right

## 2016-05-23 MED ORDER — FENTANYL CITRATE (PF) 100 MCG/2ML IJ SOLN
INTRAMUSCULAR | Status: DC | PRN
  Administered 2016-05-23 (×2): 50 ug via INTRAVENOUS

## 2016-05-23 MED ORDER — CEFAZOLIN SODIUM-DEXTROSE 2-4 GM/100ML-% IV SOLN
INTRAVENOUS | Status: AC
  Filled 2016-05-23: qty 100

## 2016-05-23 MED ORDER — PROPOFOL 10 MG/ML IV BOLUS
INTRAVENOUS | Status: AC
  Filled 2016-05-23: qty 20

## 2016-05-23 MED ORDER — SCOPOLAMINE 1 MG/3DAYS TD PT72: 1.0000 | MEDICATED_PATCH | Freq: Once | TRANSDERMAL | Status: DC | PRN

## 2016-05-23 MED ORDER — CHLORHEXIDINE GLUCONATE 4 % EX LIQD: 60.0000 mL | Freq: Once | CUTANEOUS | Status: DC

## 2016-05-23 MED ORDER — MIDAZOLAM HCL 2 MG/2ML IJ SOLN: 1.0000 mg | INTRAMUSCULAR | Status: DC | PRN

## 2016-05-23 MED ORDER — MIDAZOLAM HCL 2 MG/2ML IJ SOLN
INTRAMUSCULAR | Status: AC
  Filled 2016-05-23: qty 2

## 2016-05-23 MED ORDER — LACTATED RINGERS IV SOLN
INTRAVENOUS | Status: DC
  Administered 2016-05-23 (×2): via INTRAVENOUS

## 2016-05-23 MED ORDER — ONDANSETRON HCL 4 MG/2ML IJ SOLN
INTRAMUSCULAR | Status: AC
  Filled 2016-05-23: qty 2

## 2016-05-23 MED ORDER — FENTANYL CITRATE (PF) 100 MCG/2ML IJ SOLN
INTRAMUSCULAR | Status: AC
  Filled 2016-05-23: qty 2

## 2016-05-23 MED ORDER — GLYCOPYRROLATE 0.2 MG/ML IJ SOLN: 0.2000 mg | Freq: Once | INTRAMUSCULAR | Status: DC | PRN

## 2016-05-23 MED ORDER — CEFAZOLIN SODIUM-DEXTROSE 2-4 GM/100ML-% IV SOLN: 2.0000 g | INTRAVENOUS | Status: DC

## 2016-05-23 MED ORDER — HYDROCODONE-ACETAMINOPHEN 7.5-325 MG PO TABS: 1.0000 | ORAL_TABLET | Freq: Once | ORAL | Status: DC | PRN

## 2016-05-23 MED ORDER — MIDAZOLAM HCL 5 MG/5ML IJ SOLN
INTRAMUSCULAR | Status: DC | PRN
  Administered 2016-05-23: 2 mg via INTRAVENOUS

## 2016-05-23 MED ORDER — PROPOFOL 10 MG/ML IV BOLUS
INTRAVENOUS | Status: DC | PRN
  Administered 2016-05-23 (×2): 20 mg via INTRAVENOUS

## 2016-05-23 MED ORDER — FENTANYL CITRATE (PF) 100 MCG/2ML IJ SOLN: 25.0000 ug | INTRAMUSCULAR | Status: DC | PRN

## 2016-05-23 MED ORDER — HYDROCODONE-ACETAMINOPHEN 5-325 MG PO TABS: 1.0000 | ORAL_TABLET | Freq: Four times a day (QID) | ORAL | 0 refills | Status: DC | PRN

## 2016-05-23 MED ORDER — FENTANYL CITRATE (PF) 100 MCG/2ML IJ SOLN: 50.0000 ug | INTRAMUSCULAR | Status: DC | PRN

## 2016-05-23 MED ORDER — BUPIVACAINE HCL (PF) 0.25 % IJ SOLN
INTRAMUSCULAR | Status: DC | PRN
  Administered 2016-05-23: 5 mL

## 2016-05-23 SURGICAL SUPPLY — 34 items
BANDAGE COBAN STERILE 2 (GAUZE/BANDAGES/DRESSINGS) ×2 IMPLANT
BLADE SURG 15 STRL LF DISP TIS (BLADE) ×1 IMPLANT
BLADE SURG 15 STRL SS (BLADE) ×2
BNDG CMPR 9X4 STRL LF SNTH (GAUZE/BANDAGES/DRESSINGS) ×1
BNDG ESMARK 4X9 LF (GAUZE/BANDAGES/DRESSINGS) ×1 IMPLANT
CHLORAPREP W/TINT 26ML (MISCELLANEOUS) ×2 IMPLANT
CORDS BIPOLAR (ELECTRODE) IMPLANT
COVER BACK TABLE 60X90IN (DRAPES) ×2 IMPLANT
COVER MAYO STAND STRL (DRAPES) ×2 IMPLANT
CUFF TOURNIQUET SINGLE 18IN (TOURNIQUET CUFF) ×1 IMPLANT
DECANTER SPIKE VIAL GLASS SM (MISCELLANEOUS) IMPLANT
DRAPE EXTREMITY T 121X128X90 (DRAPE) ×2 IMPLANT
DRAPE SURG 17X23 STRL (DRAPES) ×2 IMPLANT
GAUZE SPONGE 4X4 12PLY STRL (GAUZE/BANDAGES/DRESSINGS) ×2 IMPLANT
GAUZE XEROFORM 1X8 LF (GAUZE/BANDAGES/DRESSINGS) ×2 IMPLANT
GLOVE BIOGEL PI IND STRL 7.0 (GLOVE) IMPLANT
GLOVE BIOGEL PI IND STRL 8.5 (GLOVE) ×1 IMPLANT
GLOVE BIOGEL PI INDICATOR 7.0 (GLOVE) ×2
GLOVE BIOGEL PI INDICATOR 8.5 (GLOVE) ×1
GLOVE ECLIPSE 6.5 STRL STRAW (GLOVE) ×1 IMPLANT
GLOVE SURG ORTHO 8.0 STRL STRW (GLOVE) ×2 IMPLANT
GOWN STRL REUS W/ TWL LRG LVL3 (GOWN DISPOSABLE) ×1 IMPLANT
GOWN STRL REUS W/TWL LRG LVL3 (GOWN DISPOSABLE) ×2
GOWN STRL REUS W/TWL XL LVL3 (GOWN DISPOSABLE) ×2 IMPLANT
NDL PRECISIONGLIDE 27X1.5 (NEEDLE) ×1 IMPLANT
NEEDLE PRECISIONGLIDE 27X1.5 (NEEDLE) ×2 IMPLANT
NS IRRIG 1000ML POUR BTL (IV SOLUTION) ×2 IMPLANT
PACK BASIN DAY SURGERY FS (CUSTOM PROCEDURE TRAY) ×2 IMPLANT
STOCKINETTE 4X48 STRL (DRAPES) ×2 IMPLANT
SUT ETHILON 4 0 PS 2 18 (SUTURE) ×3 IMPLANT
SYR BULB 3OZ (MISCELLANEOUS) ×2 IMPLANT
SYR CONTROL 10ML LL (SYRINGE) ×2 IMPLANT
TOWEL OR 17X24 6PK STRL BLUE (TOWEL DISPOSABLE) ×3 IMPLANT
UNDERPAD 30X30 (UNDERPADS AND DIAPERS) ×1 IMPLANT

## 2016-05-23 NOTE — Discharge Instructions (Addendum)

## 2016-05-23 NOTE — Transfer of Care (Signed)
Immediate Anesthesia Transfer of Care Note  Patient: Erik Murray  Procedure(s) Performed: Procedure(s) with comments: RELEASE TRIGGER FINGER/A-1 PULLEY right index finger (Right) - FAB  Patient Location: PACU  Anesthesia Type:Bier block  Level of Consciousness: awake and patient cooperative  Airway & Oxygen Therapy: Patient Spontanous Breathing and Patient connected to face mask oxygen  Post-op Assessment: Report given to RN and Post -op Vital signs reviewed and stable  Post vital signs: Reviewed and stable  Last Vitals:  Vitals:   05/23/16 0749  BP: (!) 159/69  Pulse: 66  Resp: 18  Temp: 36.6 C    Last Pain:  Vitals:   05/23/16 0749  TempSrc: Oral         Complications: No apparent anesthesia complications

## 2016-05-23 NOTE — Anesthesia Postprocedure Evaluation (Signed)
Anesthesia Post Note  Patient: Erik Murray  Procedure(s) Performed: Procedure(s) (LRB): RELEASE TRIGGER FINGER/A-1 PULLEY right index finger (Right)  Patient location during evaluation: PACU Anesthesia Type: MAC and Bier Block Level of consciousness: awake and alert Pain management: pain level controlled Vital Signs Assessment: post-procedure vital signs reviewed and stable Respiratory status: spontaneous breathing, nonlabored ventilation, respiratory function stable and patient connected to nasal cannula oxygen Cardiovascular status: stable and blood pressure returned to baseline Anesthetic complications: no    Last Vitals:  Vitals:   05/23/16 1000 05/23/16 1020  BP:  137/76  Pulse: 72 63  Resp: 14 16  Temp:  36.5 C    Last Pain:  Vitals:   05/23/16 1020  TempSrc: Oral  PainSc:                  [name]

## 2016-05-23 NOTE — Brief Op Note (Signed)
05/23/2016  9:41 AM  PATIENT:  Erik Murray  70 y.o. male  PRE-OPERATIVE DIAGNOSIS:  stenosing tenosynovitis right index finger  POST-OPERATIVE DIAGNOSIS:  stenosing tenosynovitis right index finger  PROCEDURE:  Procedure(s) with comments: RELEASE TRIGGER FINGER/A-1 PULLEY right index finger (Right) - FAB  SURGEON:  Surgeon(s) and Role:    * Daryll Brod, MD - Primary  PHYSICIAN ASSISTANT:   ASSISTANTS: none   ANESTHESIA:   local and regional  EBL:  No intake/output data recorded.  BLOOD ADMINISTERED:none  DRAINS: none   LOCAL MEDICATIONS USED:  BUPIVICAINE   SPECIMEN:  No Specimen  DISPOSITION OF SPECIMEN:  N/A  COUNTS:  YES  TOURNIQUET:   Total Tourniquet Time Documented: Forearm (Right) - 18 minutes Total: Forearm (Right) - 18 minutes   DICTATION: .Other Dictation: Dictation Number [id number]  PLAN OF CARE: Discharge to home after PACU  PATIENT DISPOSITION:  PACU - hemodynamically stable.

## 2016-05-23 NOTE — Anesthesia Procedure Notes (Signed)
Procedure Name: MAC Date/Time: 05/23/2016 9:20 AM Performed by: Marrianne Mood Pre-anesthesia Checklist: Patient identified, Timeout performed, Emergency Drugs available, Suction available and Patient being monitored Patient Re-evaluated:Patient Re-evaluated prior to inductionOxygen Delivery Method: Simple face mask Preoxygenation: Pre-oxygenation with 100% oxygen

## 2016-05-23 NOTE — Anesthesia Preprocedure Evaluation (Signed)
Anesthesia Evaluation  Patient identified by MRN, date of birth, ID band Patient awake    Reviewed: Allergy & Precautions, NPO status , Patient's Chart, lab work & pertinent test results  Airway Mallampati: II  TM Distance: >3 FB Neck ROM: Full    Dental   Pulmonary former smoker,    breath sounds clear to auscultation       Cardiovascular negative cardio ROS   Rhythm:Regular     Neuro/Psych negative neurological ROS     GI/Hepatic negative GI ROS, Neg liver ROS,   Endo/Other  negative endocrine ROS  Renal/GU negative Renal ROS     Musculoskeletal  (+) Arthritis ,   Abdominal   Peds  Hematology   Anesthesia Other Findings   Reproductive/Obstetrics                             Anesthesia Physical Anesthesia Plan  ASA: I  Anesthesia Plan: MAC and Bier Block   Post-op Pain Management:    Induction: Intravenous  Airway Management Planned: Natural Airway and Simple Face Mask  Additional Equipment:   Intra-op Plan:   Post-operative Plan:   Informed Consent: I have reviewed the patients History and Physical, chart, labs and discussed the procedure including the risks, benefits and alternatives for the proposed anesthesia with the patient or authorized representative who has indicated his/her understanding and acceptance.     Plan Discussed with: CRNA  Anesthesia Plan Comments:         Anesthesia Quick Evaluation

## 2016-05-23 NOTE — Op Note (Signed)
Other Dictation: Dictation Number [id number]

## 2016-05-23 NOTE — H&P (Signed)
Erik Murray is an 70 y.o. male.   Chief Complaint: catching right index finger HPI:Erik Murray is a 70 year old, right-hand dominant male who comes in complaining of catching of his right index finger for the past two weeks. He has had pain in the metacarpophalangeal joint for several months. He states approximately a month ago the index finger began having the same problem, it has been triggering to the extent to the right side is. He has had trigger thumbs in the past, which had responded to injections. He states this feels exactly the same way with pain primarily in the morning during the day. He has no history of injury. He has not tried any treatment for it such right is much worse than the left. He has a prior history of a laceration at the right palm on a nail when he was a child. He has no history of diabetes, thyroid problems, arthritis or gout. There is a family history of diabetes, arthritis and there is no history of thyroid problems or gout. He has been tested for diabetes.He states the left side has settled down the right side continues to trigger for him.He was given a second injection at that time he states that the triggering recurred 2-3 weeks after his last injection. He has been holding off because his daughter is getting married in October. States that his pain is 5/10 sharp in nature when it occurs it is worse in the morning. His left side is not bothering him to the extent that his right side is. He states that it has just not gone away and is getting tired of it catching.       Past Medical History  Diagnosis Date  . Cancer [facility])  prostate   Past Surgical History  Procedure Laterality Date  . Prostate surgery 2016       Past Medical History:  Diagnosis Date  . Arthritis   . Cancer [facility])    prostate  . Hx of adenomatous colonic polyps     Past Surgical History:  Procedure Laterality Date  . COLONOSCOPY  02/05/2012   [id number] and rectal polyps/few  diverticulosis (adenomas). next TCS 01/2015  . COLONOSCOPY N/A 02/10/2015   Procedure: COLONOSCOPY;  Surgeon: Daneil Dolin, MD;  [facility];  Service: Endoscopy;  Laterality: N/A;  . EYE SURGERY Bilateral apirl, may 2013   cataract lens replacement  . LYMPHADENECTOMY Bilateral 10/01/2014   Procedure: LYMPHADENECTOMY;  Surgeon: Raynelle Bring, MD;  Location: [facility];  Service: Urology;  Laterality: Bilateral;  . PROSTATE BIOPSY  dec 2015  . ROBOT ASSISTED LAPAROSCOPIC RADICAL PROSTATECTOMY N/A 10/01/2014   Procedure: ROBOTIC ASSISTED LAPAROSCOPIC RADICAL PROSTATECTOMY LEVEL 2;  Surgeon: Raynelle Bring, MD;  [facility];  Service: Urology;  Laterality: N/A;  . TONSILLECTOMY  as child  . TRIGGER FINGER RELEASE  10/10/2011   Procedure: RELEASE TRIGGER FINGER/A-1 PULLEY;  Surgeon: Wynonia Sours, MD;  Location: [place];  Service: Orthopedics;  Laterality: Right;  right thumb    Family History  Problem Relation Age of Onset  . Diabetes Mother   . Heart disease Mother   . Other Father     age 14, deceased form of lupus  . Colon cancer Neg Hx    Social History:  reports that he has quit smoking. His smoking use included Cigarettes. He has a 40.00 pack-year smoking history. He quit smokeless tobacco use about 10 years ago. He reports that he drinks alcohol. He reports  that he does not use drugs.  Allergies: No Known Allergies  Medications Prior to Admission  Medication Sig Dispense Refill  . Multiple Vitamins-Minerals (MULTIVITAMIN ADULT PO) Take 1 tablet by mouth daily.      No results found for this or any previous visit (from the past 48 hour(s)).  No results found.   Pertinent items are noted in HPI.  Blood pressure (!) 159/69, pulse 66, temperature 97.8 F (36.6 C), temperature source Oral, resp. rate 18, height 5\' 10"  (1.778 m), weight 106.1 kg (234 lb), SpO2 98 %.  General appearance: alert, cooperative and appears stated age Head: Normocephalic,  without obvious abnormality Neck: no JVD Resp: clear to auscultation bilaterally Cardio: regular rate and rhythm, S1, S2 normal, no murmur, click, rub or gallop GI: soft, non-tender; bowel sounds normal; no masses,  no organomegaly Extremities: catching right index finger Pulses: 2+ and symmetric Skin: Skin color, texture, turgor normal. No rashes or lesions Neurologic: Grossly normal Incision/Wound: na  Assessment/Plan Assessment:  Trigger index finger of right hand    Plan: We have discussed with him possibility of surgical release of the A1 pulley of the right index finger. He is desirous proceeding to have that done.  Postoperative course are discussed along with risk complications. He is advised that there is no guarantee to the surgery the possibility of infection recurrence injury to arteries nerves tendons incomplete relief of symptoms and dystrophy. He is scheduled for release A1 pulley right index finger as an outpatient under regional anesthesia      Artasia Thang R 05/23/2016, 8:30 AM

## 2016-05-24 NOTE — Op Note (Signed)
NAME:  Erik Murray, Erik Murray NO.:  [id number]  MEDICAL RECORD NO.:  [id number]  LOCATION:                                 FACILITY:  PHYSICIAN:  Daryll Brod, M.D.            DATE OF BIRTH:  DATE OF PROCEDURE:  05/23/2016 DATE OF DISCHARGE:                              OPERATIVE REPORT   PREOPERATIVE DIAGNOSIS:  Stenosing tenosynovitis, right index finger.  POSTOPERATIVE DIAGNOSIS:  Stenosing tenosynovitis, right index finger.  OPERATION:  Release of A1 pulley, right index finger.  SURGEON:  Daryll Brod, M.D.  ANESTHESIA:  Forearm-based IV regional with local infiltration.  PLACE OF SURGERY:  [facility].  HISTORY:  The patient is a 70 year old male with a history of triggering of his right index finger.  This has not responded to conservative treatment.  He has elected to undergo surgical release of the A1 pulley. Pre, peri and postoperative course have been discussed along with risks and complications.  He is aware that there was no guarantee to the surgery; the possibility of infection; recurrence of injury to arteries, nerves, tendons and incomplete relief of symptoms and dystrophy.  In the preoperative area, the patient is seen, the extremity marked by both patient and surgeon, and antibiotic given.  PROCEDURE IN DETAIL:  The patient was brought to the operating room, where a forearm-based IV regional anesthetic was carried out without difficulty.  He was prepped using ChloraPrep in a supine position with the right arm free.  A 3-minute dry time was allowed and the time-out taken, confirming the patient and procedure.  An oblique incision was made over the A1 pulley of the right index finger.  This was carried down through the subcutaneous tissue.  Retractors were placed protecting neurovascular bundle radially and ulnarly.  The A1 pulley was found to be markedly thickened.  This was released on its radial aspect.  A small incision was made  centrally in A2.  A partial tenosynovectomy was performed proximally with separation of the superficialis and profundus, to be certain, there were no adhesions between the two.  The finger was passed through a full passive range of motion.  No further triggering was noted.  The wound was copiously irrigated with saline.  The skin was then closed with interrupted 4-0 nylon sutures.  A local infiltration with 0.25% bupivacaine without epinephrine was given, approximately 6 mL was used.  A sterile compressive dressing with the fingers free was applied.  On deflation of the tourniquet, all fingers were immediately pinked.  He was taken to the recovery room for observation in satisfactory condition.  He will be discharged to home to return to the [facility] in 1 week, on Norco.          ______________________________ Daryll Brod, M.D.     GK/MEDQ  D:  05/23/2016  T:  05/24/2016  Job:  [id number]

## 2016-05-25 ENCOUNTER — Encounter (HOSPITAL_BASED_OUTPATIENT_CLINIC_OR_DEPARTMENT_OTHER): Payer: Self-pay | Admitting: Orthopedic Surgery

## 2016-05-25 NOTE — Addendum Note (Signed)
Addendum  created 05/25/16 1111 by Suzette Battiest, MD   Anesthesia Staff edited

## 2016-05-31 ENCOUNTER — Encounter (HOSPITAL_BASED_OUTPATIENT_CLINIC_OR_DEPARTMENT_OTHER): Payer: Self-pay | Admitting: Orthopedic Surgery

## 2016-06-26 ENCOUNTER — Other Ambulatory Visit: Payer: Self-pay | Admitting: Orthopedic Surgery

## 2016-07-12 ENCOUNTER — Encounter (HOSPITAL_BASED_OUTPATIENT_CLINIC_OR_DEPARTMENT_OTHER): Payer: Self-pay | Admitting: *Deleted

## 2016-07-18 ENCOUNTER — Ambulatory Visit (HOSPITAL_BASED_OUTPATIENT_CLINIC_OR_DEPARTMENT_OTHER)
Admission: RE | Admit: 2016-07-18 | Discharge: 2016-07-18 | Disposition: A | Discharge status: Home / Self Care | Payer: Medicare Other | Source: Ambulatory Visit | Attending: Orthopedic Surgery | Admitting: Orthopedic Surgery

## 2016-07-18 ENCOUNTER — Encounter (HOSPITAL_BASED_OUTPATIENT_CLINIC_OR_DEPARTMENT_OTHER)
Admission: RE | Disposition: A | Discharge status: Home / Self Care | Payer: Self-pay | Source: Ambulatory Visit | Attending: Orthopedic Surgery

## 2016-07-18 ENCOUNTER — Ambulatory Visit (HOSPITAL_BASED_OUTPATIENT_CLINIC_OR_DEPARTMENT_OTHER): Payer: Medicare Other | Admitting: Certified Registered"

## 2016-07-18 ENCOUNTER — Encounter (HOSPITAL_BASED_OUTPATIENT_CLINIC_OR_DEPARTMENT_OTHER): Payer: Self-pay | Admitting: Certified Registered"

## 2016-07-18 DIAGNOSIS — Z87891 Personal history of nicotine dependence: Secondary | ICD-10-CM | POA: Diagnosis not present

## 2016-07-18 DIAGNOSIS — M65322 Trigger finger, left index finger: Secondary | ICD-10-CM | POA: Diagnosis not present

## 2016-07-18 DIAGNOSIS — Z8349 Family history of other endocrine, nutritional and metabolic diseases: Secondary | ICD-10-CM | POA: Insufficient documentation

## 2016-07-18 DIAGNOSIS — Z8546 Personal history of malignant neoplasm of prostate: Secondary | ICD-10-CM | POA: Insufficient documentation

## 2016-07-18 DIAGNOSIS — Z833 Family history of diabetes mellitus: Secondary | ICD-10-CM | POA: Diagnosis not present

## 2016-07-18 DIAGNOSIS — Z8249 Family history of ischemic heart disease and other diseases of the circulatory system: Secondary | ICD-10-CM | POA: Insufficient documentation

## 2016-07-18 DIAGNOSIS — Z9889 Other specified postprocedural states: Secondary | ICD-10-CM | POA: Insufficient documentation

## 2016-07-18 DIAGNOSIS — M1990 Unspecified osteoarthritis, unspecified site: Secondary | ICD-10-CM | POA: Insufficient documentation

## 2016-07-18 DIAGNOSIS — Z86010 Personal history of colonic polyps: Secondary | ICD-10-CM | POA: Diagnosis not present

## 2016-07-18 HISTORY — PX: TRIGGER FINGER RELEASE: SHX641

## 2016-07-18 SURGERY — RELEASE, A1 PULLEY, FOR TRIGGER FINGER
Anesthesia: Regional | Site: Hand | Laterality: Left

## 2016-07-18 MED ORDER — LACTATED RINGERS IV SOLN
INTRAVENOUS | Status: DC
  Administered 2016-07-18: 14:00:00 via INTRAVENOUS

## 2016-07-18 MED ORDER — BUPIVACAINE-EPINEPHRINE (PF) 0.25% -1:200000 IJ SOLN
INTRAMUSCULAR | Status: AC
  Filled 2016-07-18: qty 30

## 2016-07-18 MED ORDER — SCOPOLAMINE 1 MG/3DAYS TD PT72: 1.0000 | MEDICATED_PATCH | Freq: Once | TRANSDERMAL | Status: DC | PRN

## 2016-07-18 MED ORDER — OXYCODONE HCL 5 MG/5ML PO SOLN: 5.0000 mg | Freq: Once | ORAL | Status: DC | PRN

## 2016-07-18 MED ORDER — FENTANYL CITRATE (PF) 100 MCG/2ML IJ SOLN: 50.0000 ug | INTRAMUSCULAR | Status: DC | PRN

## 2016-07-18 MED ORDER — FENTANYL CITRATE (PF) 100 MCG/2ML IJ SOLN
INTRAMUSCULAR | Status: AC
  Filled 2016-07-18: qty 2

## 2016-07-18 MED ORDER — MIDAZOLAM HCL 2 MG/2ML IJ SOLN
INTRAMUSCULAR | Status: AC
  Filled 2016-07-18: qty 2

## 2016-07-18 MED ORDER — OXYCODONE HCL 5 MG PO TABS: 5.0000 mg | ORAL_TABLET | Freq: Once | ORAL | Status: DC | PRN

## 2016-07-18 MED ORDER — HYDROCODONE-ACETAMINOPHEN 5-325 MG PO TABS: 1.0000 | ORAL_TABLET | Freq: Four times a day (QID) | ORAL | 0 refills | Status: DC | PRN

## 2016-07-18 MED ORDER — CEFAZOLIN SODIUM-DEXTROSE 2-4 GM/100ML-% IV SOLN
INTRAVENOUS | Status: AC
  Filled 2016-07-18: qty 100

## 2016-07-18 MED ORDER — MIDAZOLAM HCL 5 MG/5ML IJ SOLN
INTRAMUSCULAR | Status: DC | PRN
  Administered 2016-07-18: 0.5 mg via INTRAVENOUS

## 2016-07-18 MED ORDER — BUPIVACAINE HCL (PF) 0.25 % IJ SOLN
INTRAMUSCULAR | Status: DC | PRN
  Administered 2016-07-18: 5.5 mL

## 2016-07-18 MED ORDER — LACTATED RINGERS IV SOLN: INTRAVENOUS | Status: DC

## 2016-07-18 MED ORDER — ONDANSETRON HCL 4 MG/2ML IJ SOLN
INTRAMUSCULAR | Status: AC
  Filled 2016-07-18: qty 2

## 2016-07-18 MED ORDER — LACTATED RINGERS IV SOLN
INTRAVENOUS | Status: DC | PRN
  Administered 2016-07-18: 14:00:00 via INTRAVENOUS

## 2016-07-18 MED ORDER — FENTANYL CITRATE (PF) 100 MCG/2ML IJ SOLN: 25.0000 ug | INTRAMUSCULAR | Status: DC | PRN

## 2016-07-18 MED ORDER — MEPERIDINE HCL 25 MG/ML IJ SOLN: 6.2500 mg | INTRAMUSCULAR | Status: DC | PRN

## 2016-07-18 MED ORDER — FENTANYL CITRATE (PF) 100 MCG/2ML IJ SOLN
INTRAMUSCULAR | Status: DC | PRN
  Administered 2016-07-18 (×2): 50 ug via INTRAVENOUS

## 2016-07-18 MED ORDER — PROPOFOL 10 MG/ML IV BOLUS
INTRAVENOUS | Status: DC | PRN
  Administered 2016-07-18 (×3): 20 mg via INTRAVENOUS

## 2016-07-18 MED ORDER — LIDOCAINE HCL (PF) 0.5 % IJ SOLN
INTRAMUSCULAR | Status: DC | PRN
  Administered 2016-07-18: 30 mL via INTRAVENOUS

## 2016-07-18 MED ORDER — CEFAZOLIN SODIUM-DEXTROSE 2-4 GM/100ML-% IV SOLN
2.0000 g | INTRAVENOUS | Status: AC
  Administered 2016-07-18: 2 g via INTRAVENOUS

## 2016-07-18 MED ORDER — ONDANSETRON HCL 4 MG/2ML IJ SOLN
INTRAMUSCULAR | Status: DC | PRN
  Administered 2016-07-18: 4 mg via INTRAVENOUS

## 2016-07-18 MED ORDER — PROMETHAZINE HCL 25 MG/ML IJ SOLN: 6.2500 mg | INTRAMUSCULAR | Status: DC | PRN

## 2016-07-18 MED ORDER — MIDAZOLAM HCL 2 MG/2ML IJ SOLN: 1.0000 mg | INTRAMUSCULAR | Status: DC | PRN

## 2016-07-18 MED ORDER — CHLORHEXIDINE GLUCONATE 4 % EX LIQD: 60.0000 mL | Freq: Once | CUTANEOUS | Status: DC

## 2016-07-18 SURGICAL SUPPLY — 39 items
BANDAGE COBAN STERILE 2 (GAUZE/BANDAGES/DRESSINGS) ×3 IMPLANT
BLADE SURG 15 STRL LF DISP TIS (BLADE) ×1 IMPLANT
BLADE SURG 15 STRL SS (BLADE) ×3
BNDG CMPR 9X4 STRL LF SNTH (GAUZE/BANDAGES/DRESSINGS)
BNDG ESMARK 4X9 LF (GAUZE/BANDAGES/DRESSINGS) IMPLANT
CHLORAPREP W/TINT 26ML (MISCELLANEOUS) ×3 IMPLANT
CORDS BIPOLAR (ELECTRODE) IMPLANT
COVER BACK TABLE 60X90IN (DRAPES) ×3 IMPLANT
COVER MAYO STAND STRL (DRAPES) ×3 IMPLANT
CUFF TOURNIQUET SINGLE 18IN (TOURNIQUET CUFF) ×2 IMPLANT
DECANTER SPIKE VIAL GLASS SM (MISCELLANEOUS) IMPLANT
DRAPE EXTREMITY T 121X128X90 (DRAPE) ×3 IMPLANT
DRAPE SURG 17X23 STRL (DRAPES) ×3 IMPLANT
GAUZE SPONGE 4X4 12PLY STRL (GAUZE/BANDAGES/DRESSINGS) ×3 IMPLANT
GAUZE XEROFORM 1X8 LF (GAUZE/BANDAGES/DRESSINGS) ×3 IMPLANT
GLOVE BIOGEL PI IND STRL 7.0 (GLOVE) IMPLANT
GLOVE BIOGEL PI IND STRL 8 (GLOVE) IMPLANT
GLOVE BIOGEL PI IND STRL 8.5 (GLOVE) ×1 IMPLANT
GLOVE BIOGEL PI INDICATOR 7.0 (GLOVE) ×2
GLOVE BIOGEL PI INDICATOR 8 (GLOVE) ×2
GLOVE BIOGEL PI INDICATOR 8.5 (GLOVE) ×2
GLOVE SURG ORTHO 8.0 STRL STRW (GLOVE) ×3 IMPLANT
GLOVE SURG SYN 7.5  E (GLOVE) ×2
GLOVE SURG SYN 7.5 E (GLOVE) ×1 IMPLANT
GLOVE SURG SYN 7.5 PF PI (GLOVE) IMPLANT
GOWN STRL REUS W/ TWL LRG LVL3 (GOWN DISPOSABLE) ×1 IMPLANT
GOWN STRL REUS W/TWL 2XL LVL3 (GOWN DISPOSABLE) ×2 IMPLANT
GOWN STRL REUS W/TWL LRG LVL3 (GOWN DISPOSABLE)
GOWN STRL REUS W/TWL XL LVL3 (GOWN DISPOSABLE) ×3 IMPLANT
NDL PRECISIONGLIDE 27X1.5 (NEEDLE) ×1 IMPLANT
NEEDLE PRECISIONGLIDE 27X1.5 (NEEDLE) ×3 IMPLANT
NS IRRIG 1000ML POUR BTL (IV SOLUTION) ×3 IMPLANT
PACK BASIN DAY SURGERY FS (CUSTOM PROCEDURE TRAY) ×3 IMPLANT
STOCKINETTE 4X48 STRL (DRAPES) ×3 IMPLANT
SUT ETHILON 4 0 PS 2 18 (SUTURE) ×4 IMPLANT
SYR BULB 3OZ (MISCELLANEOUS) ×3 IMPLANT
SYR CONTROL 10ML LL (SYRINGE) ×3 IMPLANT
TOWEL OR 17X24 6PK STRL BLUE (TOWEL DISPOSABLE) ×6 IMPLANT
UNDERPAD 30X30 (UNDERPADS AND DIAPERS) ×3 IMPLANT

## 2016-07-18 NOTE — Anesthesia Postprocedure Evaluation (Signed)
Anesthesia Post Note  Patient: Erik Murray  Procedure(s) Performed: Procedure(s) (LRB): RELEASE TRIGGER FINGER/A-1 PULLEY, left index (Left)  Patient location during evaluation: PACU Anesthesia Type: Bier Block Level of consciousness: awake and alert Pain management: pain level controlled Vital Signs Assessment: post-procedure vital signs reviewed and stable Respiratory status: spontaneous breathing, nonlabored ventilation, respiratory function stable and patient connected to nasal cannula oxygen Cardiovascular status: stable and blood pressure returned to baseline Anesthetic complications: no    Last Vitals:  Vitals:   07/18/16 1412 07/18/16 1415  BP:  129/72  Pulse: 63 68  Resp: 14 12  Temp: 36.4 C     Last Pain:  Vitals:   07/18/16 1430  TempSrc:   PainSc: 0-No pain                 Effie Berkshire

## 2016-07-18 NOTE — H&P (Signed)
Erik Murray is an 71 y.o. male.   Chief Complaint: trigger left index HPI: Erik Murray is a 69 year old, right-hand dominant male who comes in complaining of catching of his left index finger for the past two weeks. He has had pain in the metacarpophalangeal joint for several months. He states approximately a month ago the left index finger began having the same problem, it has been triggering to the extent to the right side is. He has had trigger thumbs in the past, which had responded to injections. He states this feels exactly the same way with pain primarily in the morning during the day. He has no history of injury. He has not tried any treatment for it such left is much worse than theright. He has a prior history of a laceration at the right palm on a nail when he was a child. He has no history of diabetes, thyroid problems, arthritis or gout. There is a family history of diabetes, arthritis and there is no history of thyroid problems or gout. He has been tested for diabetes.He has been given a second injection left thumb but he states that the triggering recurred 2-3 weeks after his last injection.           Past Medical History:  Diagnosis Date  . Arthritis   . Cancer [facility])    prostate  . Hx of adenomatous colonic polyps     Past Surgical History:  Procedure Laterality Date  . COLONOSCOPY  02/05/2012   [id number] and rectal polyps/few diverticulosis (adenomas). next TCS 01/2015  . COLONOSCOPY N/A 02/10/2015   Procedure: COLONOSCOPY;  Surgeon: Daneil Dolin, MD;  [facility];  Service: Endoscopy;  Laterality: N/A;  . EYE SURGERY Bilateral apirl, may 2013   cataract lens replacement  . LYMPHADENECTOMY Bilateral 10/01/2014   Procedure: LYMPHADENECTOMY;  Surgeon: Raynelle Bring, MD;  [facility];  Service: Urology;  Laterality: Bilateral;  . PROSTATE BIOPSY  dec 2015  . ROBOT ASSISTED LAPAROSCOPIC RADICAL PROSTATECTOMY N/A 10/01/2014   Procedure: ROBOTIC  ASSISTED LAPAROSCOPIC RADICAL PROSTATECTOMY LEVEL 2;  Surgeon: Raynelle Bring, MD;  [facility];  Service: Urology;  Laterality: N/A;  . TONSILLECTOMY  as child  . TRIGGER FINGER RELEASE  10/10/2011   Procedure: RELEASE TRIGGER FINGER/A-1 PULLEY;  Surgeon: Wynonia Sours, MD;  Location: [place];  Service: Orthopedics;  Laterality: Right;  right thumb  . TRIGGER FINGER RELEASE Right 05/23/2016   Procedure: RELEASE TRIGGER FINGER/A-1 PULLEY right index finger;  Surgeon: Daryll Brod, MD;  Location: [place];  Service: Orthopedics;  Laterality: Right;  FAB    Family History  Problem Relation Age of Onset  . Diabetes Mother   . Heart disease Mother   . Other Father     age 12, deceased form of lupus  . Colon cancer Neg Hx    Social History:  reports that he has quit smoking. His smoking use included Cigarettes. He has a 40.00 pack-year smoking history. He quit smokeless tobacco use about 10 years ago. He reports that he drinks alcohol. He reports that he does not use drugs.  Allergies: No Known Allergies  No prescriptions prior to admission.    No results found for this or any previous visit (from the past 48 hour(s)).  No results found.   Pertinent items are noted in HPI.  Height 5\' 10"  (1.778 m), weight 106.1 kg (234 lb).  General appearance: alert, cooperative and appears stated age Head: Normocephalic,  without obvious abnormality Neck: no JVD Resp: clear to auscultation bilaterally Cardio: regular rate and rhythm, S1, S2 normal, no murmur, click, rub or gallop GI: soft, non-tender; bowel sounds normal; no masses,  no organomegaly Extremities: catching left index finger Pulses: 2+ and symmetric Skin: Skin color, texture, turgor normal. No rashes or lesions Neurologic: Grossly normal Incision/Wound: na  Assessment/Plan Assessment:  1. Trigger index finger of left hand    Plan: We have discussed with him possibility of surgical release  of the A1 pulley of the left index finger. He is desirous proceeding to have that done. 3. Postoperative course are discussed along with risk complications. He is advised that there is no guarantee to the surgery the possibility of infection recurrence injury to arteries nerves tendons incomplete relief of symptoms and dystrophy. He is scheduled for release A1 pulley  left index finger as an outpatient under regional anesthesia       Kelen Laura R 07/18/2016, 11:05 AM

## 2016-07-18 NOTE — Discharge Instructions (Addendum)

## 2016-07-18 NOTE — Brief Op Note (Signed)
07/18/2016  2:06 PM  PATIENT:  Erik Murray  70 y.o. male  PRE-OPERATIVE DIAGNOSIS:  stenosing tenosynovitis left index  POST-OPERATIVE DIAGNOSIS:  stenosing tenosynovitis left index  PROCEDURE:  Procedure(s) with comments: RELEASE TRIGGER FINGER/A-1 PULLEY, left index (Left) - FAB  SURGEON:  Surgeon(s) and Role:    * Daryll Brod, MD - Primary  PHYSICIAN ASSISTANT:   ASSISTANTS: none   ANESTHESIA:   local and regional  EBL:  No intake/output data recorded.  BLOOD ADMINISTERED:none  DRAINS: none   LOCAL MEDICATIONS USED:  BUPIVICAINE   SPECIMEN:  No Specimen  DISPOSITION OF SPECIMEN:  N/A  COUNTS:  YES  TOURNIQUET:   Total Tourniquet Time Documented: Forearm (Left) - 17 minutes Total: Forearm (Left) - 17 minutes   DICTATION: .Other Dictation: Dictation Number [id number]  PLAN OF CARE: Discharge to home after PACU  PATIENT DISPOSITION:  PACU - hemodynamically stable.

## 2016-07-18 NOTE — Transfer of Care (Signed)
Immediate Anesthesia Transfer of Care Note  Patient: Erik Murray  Procedure(s) Performed: Procedure(s) with comments: RELEASE TRIGGER FINGER/A-1 PULLEY, left index (Left) - FAB  Patient Location: PACU  Anesthesia Type:Bier block  Level of Consciousness: awake, alert  and oriented  Airway & Oxygen Therapy: Patient Spontanous Breathing  Post-op Assessment: Report given to RN and Post -op Vital signs reviewed and stable  Post vital signs: Reviewed and stable  Last Vitals:  Vitals:   07/18/16 1315  BP: (!) 147/78  Pulse: 60  Resp: 20  Temp: 36.7 C    Last Pain:  Vitals:   07/18/16 1315  TempSrc: Oral         Complications: No apparent anesthesia complications

## 2016-07-18 NOTE — Op Note (Signed)
   Dictation Number [id number]

## 2016-07-18 NOTE — Anesthesia Preprocedure Evaluation (Addendum)
Anesthesia Evaluation  Patient identified by MRN, date of birth, ID band Patient awake    Reviewed: Allergy & Precautions, NPO status , Patient's Chart, lab work & pertinent test results  Airway Mallampati: II  TM Distance: >3 FB Neck ROM: Full    Dental  (+) Teeth Intact, Dental Advisory Given   Pulmonary former smoker,    breath sounds clear to auscultation       Cardiovascular negative cardio ROS   Rhythm:Regular Rate:Normal     Neuro/Psych negative neurological ROS  negative psych ROS   GI/Hepatic negative GI ROS, Neg liver ROS,   Endo/Other  negative endocrine ROS  Renal/GU negative Renal ROS  negative genitourinary   Musculoskeletal  (+) Arthritis , Osteoarthritis,    Abdominal   Peds negative pediatric ROS (+)  Hematology negative hematology ROS (+)   Anesthesia Other Findings   Reproductive/Obstetrics negative OB ROS                            Anesthesia Physical Anesthesia Plan  ASA: I  Anesthesia Plan: Bier Block   Post-op Pain Management:    Induction: Intravenous  Airway Management Planned: Natural Airway  Additional Equipment:   Intra-op Plan:   Post-operative Plan:   Informed Consent: I have reviewed the patients History and Physical, chart, labs and discussed the procedure including the risks, benefits and alternatives for the proposed anesthesia with the patient or authorized representative who has indicated his/her understanding and acceptance.     Plan Discussed with: CRNA  Anesthesia Plan Comments:         Anesthesia Quick Evaluation

## 2016-07-19 ENCOUNTER — Encounter (HOSPITAL_BASED_OUTPATIENT_CLINIC_OR_DEPARTMENT_OTHER): Payer: Self-pay | Admitting: Orthopedic Surgery

## 2016-07-19 NOTE — Op Note (Signed)
NAME:  TRENTIN, SANDA NO.:  [id number]  MEDICAL RECORD NO.:  [id number]  LOCATION:                                 FACILITY:  PHYSICIAN:  Daryll Brod, M.D.            DATE OF BIRTH:  DATE OF PROCEDURE:  07/18/2016 DATE OF DISCHARGE:                              OPERATIVE REPORT   PREOPERATIVE DIAGNOSIS:  Stenosing tenosynovitis, left index finger.  POSTOPERATIVE DIAGNOSIS:  Stenosing tenosynovitis, left index finger.  OPERATION:  Release of A1 pulley, left index finger.  SURGEON:  Daryll Brod, M.D.  ANESTHESIA:  Forearm-based IV regional with local infiltration.  ANESTHESIOLOGIST:  [place].  PLACE OF SURGERY:  [facility].  HISTORY:  The patient is a 70 year old male with a history of bilateral trigger fingers, index fingers involved.  These have not responded to conservative treatment.  On his left side, he has elected to undergo release of the A1 pulley of the left index finger.  Pre, peri and postoperative course have been discussed along with risks and complications.  He is aware that there is no guarantee to the surgery; the possibility of infection; recurrence of injury to arteries, nerves, tendons; incomplete relief of symptoms and dystrophy.  In the preoperative area, the patient is seen, the extremity marked by both the patient and surgeon and antibiotic given.  PROCEDURE IN DETAIL:  The patient was brought to the operating room, where a forearm-based IV regional anesthetic was carried out without difficulty under the direction of the Anesthesia Department.  He was prepped using ChloraPrep in supine position with the left arm free.  A 3- minute dry time was allowed and time-out taken confirming the patient and procedure.  After adequate anesthesia was afforded, an oblique incision was made over the A1 pulley of the left index finger.  This was carried down through subcutaneous tissue.  Bleeders were electrocauterized with bipolar.   The neurovascular bundles radially and ulnarly were identified.  Significant scarring was present about the A1 pulley of the index finger.  The A1 pulley was found to be markedly thickened.  This was released on its radial aspect.  A small incision was made centrally in A2.  Partial tenosynovectomy was performed proximally and then separation of the two tendons to be certain there was no adhesion between the tenosynovial tissue of the two tendons was performed.  The finger was placed through a full passive range motion. No further triggering was noted.  The wound was copiously irrigated with saline.  The skin was closed with interrupted 4-0 nylon sutures.  A local infiltration with 0.25% bupivacaine without epinephrine was given, approximately 5 mL was used.  A sterile compressive dressing with the fingers free was applied. On deflation of the tourniquet, all fingers immediately pinked.  He was taken to the recovery room for observation in satisfactory condition. He will be discharged to home to return to the [facility] in 1 week on Norco.          ______________________________ Daryll Brod, M.D.     GK/MEDQ  D:  07/18/2016  T:  07/19/2016  Job:  [id number]

## 2016-09-05 ENCOUNTER — Encounter (HOSPITAL_COMMUNITY)
Admission: RE | Disposition: A | Discharge status: Home / Self Care | Payer: Self-pay | Source: Ambulatory Visit | Attending: Ophthalmology

## 2016-09-05 ENCOUNTER — Ambulatory Visit (HOSPITAL_COMMUNITY)
Admission: RE | Admit: 2016-09-05 | Discharge: 2016-09-05 | Disposition: A | Discharge status: Home / Self Care | Payer: Medicare Other | Source: Ambulatory Visit | Attending: Ophthalmology | Admitting: Ophthalmology

## 2016-09-05 ENCOUNTER — Encounter (HOSPITAL_COMMUNITY): Payer: Self-pay

## 2016-09-05 DIAGNOSIS — Z8546 Personal history of malignant neoplasm of prostate: Secondary | ICD-10-CM | POA: Insufficient documentation

## 2016-09-05 DIAGNOSIS — Z9889 Other specified postprocedural states: Secondary | ICD-10-CM | POA: Diagnosis not present

## 2016-09-05 DIAGNOSIS — H524 Presbyopia: Secondary | ICD-10-CM | POA: Insufficient documentation

## 2016-09-05 DIAGNOSIS — H26491 Other secondary cataract, right eye: Secondary | ICD-10-CM | POA: Diagnosis not present

## 2016-09-05 DIAGNOSIS — Z833 Family history of diabetes mellitus: Secondary | ICD-10-CM | POA: Insufficient documentation

## 2016-09-05 DIAGNOSIS — Z8349 Family history of other endocrine, nutritional and metabolic diseases: Secondary | ICD-10-CM | POA: Insufficient documentation

## 2016-09-05 DIAGNOSIS — Z87891 Personal history of nicotine dependence: Secondary | ICD-10-CM | POA: Diagnosis not present

## 2016-09-05 DIAGNOSIS — Z961 Presence of intraocular lens: Secondary | ICD-10-CM | POA: Insufficient documentation

## 2016-09-05 DIAGNOSIS — Z9841 Cataract extraction status, right eye: Secondary | ICD-10-CM | POA: Diagnosis not present

## 2016-09-05 DIAGNOSIS — Z8249 Family history of ischemic heart disease and other diseases of the circulatory system: Secondary | ICD-10-CM | POA: Diagnosis not present

## 2016-09-05 HISTORY — PX: YAG LASER APPLICATION: SHX6189

## 2016-09-05 SURGERY — TREATMENT, USING YAG LASER
Anesthesia: LOCAL | Laterality: Right

## 2016-09-05 MED ORDER — TROPICAMIDE 1 % OP SOLN
OPHTHALMIC | Status: AC
  Filled 2016-09-05: qty 15

## 2016-09-05 MED ORDER — TETRACAINE HCL 0.5 % OP SOLN
1.0000 [drp] | OPHTHALMIC | Status: AC
  Administered 2016-09-05 (×3): 1 [drp] via OPHTHALMIC

## 2016-09-05 MED ORDER — TROPICAMIDE 1 % OP SOLN
1.0000 [drp] | OPHTHALMIC | Status: AC
  Administered 2016-09-05 (×3): 1 [drp] via OPHTHALMIC

## 2016-09-05 MED ORDER — TETRACAINE HCL 0.5 % OP SOLN
OPHTHALMIC | Status: AC
  Filled 2016-09-05: qty 4

## 2016-09-05 NOTE — Op Note (Signed)
Richelle Glick T. Gershon Crane, MD  Procedure: Yag Capsulotomy  Yag Laser Self Test Completedyes. Procedure: Posterior Capsulotomy, Eye Protection Worn by Staff yes. Laser In Use Sign on Door yes.  Laser: Nd:YAG Spot Size: Fixed Burst Mode: III Power Setting: 3.4 mJ/burst Number of shots: 16 Total energy delivered: 51.63 mJ   The patient tolerated the procedure without difficulty. No complications were encountered.   The patient was discharged home with the instructions to continue all her current glaucoma medications, if any.   Patient instructed to go to office at 0100 for intraocular pressure check.  Patient verbalizes understanding of discharge instructions Yes.  .    Pre-Operative Diagnosis: After-Cataract, obscuring vision, 366.53 OD Post-Operative Diagnosis: After-Cataract, obscuring vision, 366.53 OD Date of Cataract Surgery: 10/19/2013

## 2016-09-05 NOTE — Discharge Instructions (Signed)
°  °        [facility] Instructions °[address] [place] [address] °    ° °1. Avoid closing eyes tightly. One often closes the eye tightly when laughing, talking, sneezing, coughing or if they feel irritated. At these times, you should be careful not to close your eyes tightly. ° °2. Instill eye drops as instructed. To instill drops in your eye, open it, look up and have someone gently pull the lower lid down and instill a couple of drops inside the lower lid. ° °3. Do not touch upper lid. ° °4. Take Advil or Tylenol for pain. ° °5. You may use either eye for near work, such as reading or sewing and you may watch television. ° °6. You may have your hair done at the beauty parlor at any time. ° °7. Wear dark glasses with or without your own glasses if you are in bright light. ° °8. Call our office at [phone] or [phone] if you have sharp pain in your eye or unusual symptoms. ° °9.  FOLLOW UP WITH DR. SHAPIRO TODAY IN HIS [place] OFFICE AT 3:15pm. ° °  °I have received a copy of the above instructions and will follow them.  ° ° ° °IF YOU ARE IN IMMEDIATE DANGER CALL 911! ° °It is important for you to keep your follow-up appointment with your physician after discharge, OR, for you /your caregiver to make a follow-up appointment with your physician / medical provider after discharge. ° °Show these instructions to the next healthcare provider you see. ° ° °Moderate Conscious Sedation, Adult, Care After °These instructions provide you with information about caring for yourself after your procedure. Your health care provider may also give you more specific instructions. Your treatment has been planned according to current medical practices, but problems sometimes occur. Call your health care provider if you have any problems or questions after your procedure. °What can I expect after the procedure? °After your procedure, it is common: °· To feel sleepy for several  hours. °· To feel clumsy and have poor balance for several hours. °· To have poor judgment for several hours. °· To vomit if you eat too soon. °Follow these instructions at home: °For at least 24 hours after the procedure:  ° °· Do not: °¨ Participate in activities where you could fall or become injured. °¨ Drive. °¨ Use heavy machinery. °¨ Drink alcohol. °¨ Take sleeping pills or medicines that cause drowsiness. °¨ Make important decisions or sign legal documents. °¨ Take care of children on your own. °· Rest. °Eating and drinking  °· Follow the diet recommended by your health care provider. °· If you vomit: °¨ Drink water, juice, or soup when you can drink without vomiting. °¨ Make sure you have little or no nausea before eating solid foods. °General instructions  °· Have a responsible adult stay with you until you are awake and alert. °· Take over-the-counter and prescription medicines only as told by your health care provider. °· If you smoke, do not smoke without supervision. °· Keep all follow-up visits as told by your health care provider. This is important. °Contact a health care provider if: °· You keep feeling nauseous or you keep vomiting. °· You feel light-headed. °· You develop a rash. °· You have a fever. °Get help right away if: °· You have trouble breathing. °This information is not intended to replace advice given to you by your health care provider. Make   any questions you have with your health care provider. °Document Released: 05/21/2013 Document Revised: 01/03/2016 Document Reviewed: 11/20/2015 °Elsevier Interactive Patient Education © 2017 [employer] ° ° °

## 2016-09-05 NOTE — H&P (Signed)
The patient was re examined and there is no change in the patients condition since the original H and P. 

## 2016-09-07 ENCOUNTER — Encounter (HOSPITAL_COMMUNITY): Payer: Self-pay | Admitting: Ophthalmology

## 2016-09-26 ENCOUNTER — Encounter (HOSPITAL_COMMUNITY): Payer: Self-pay

## 2016-09-26 ENCOUNTER — Encounter (HOSPITAL_COMMUNITY)
Admission: RE | Disposition: A | Discharge status: Home / Self Care | Payer: Self-pay | Source: Ambulatory Visit | Attending: Ophthalmology

## 2016-09-26 ENCOUNTER — Ambulatory Visit (HOSPITAL_COMMUNITY)
Admission: RE | Admit: 2016-09-26 | Discharge: 2016-09-26 | Disposition: A | Discharge status: Home / Self Care | Payer: Medicare Other | Source: Ambulatory Visit | Attending: Ophthalmology | Admitting: Ophthalmology

## 2016-09-26 DIAGNOSIS — Z9889 Other specified postprocedural states: Secondary | ICD-10-CM | POA: Diagnosis not present

## 2016-09-26 DIAGNOSIS — Z9842 Cataract extraction status, left eye: Secondary | ICD-10-CM | POA: Insufficient documentation

## 2016-09-26 DIAGNOSIS — Z8546 Personal history of malignant neoplasm of prostate: Secondary | ICD-10-CM | POA: Diagnosis not present

## 2016-09-26 DIAGNOSIS — H26492 Other secondary cataract, left eye: Secondary | ICD-10-CM | POA: Diagnosis present

## 2016-09-26 DIAGNOSIS — Z86010 Personal history of colonic polyps: Secondary | ICD-10-CM | POA: Insufficient documentation

## 2016-09-26 DIAGNOSIS — M1991 Primary osteoarthritis, unspecified site: Secondary | ICD-10-CM | POA: Insufficient documentation

## 2016-09-26 HISTORY — PX: YAG LASER APPLICATION: SHX6189

## 2016-09-26 SURGERY — TREATMENT, USING YAG LASER
Anesthesia: LOCAL | Laterality: Left

## 2016-09-26 MED ORDER — CYCLOPENTOLATE-PHENYLEPHRINE 0.2-1 % OP SOLN
OPHTHALMIC | Status: AC
Start: 2016-09-26 — End: 2016-09-26
  Filled 2016-09-26: qty 2

## 2016-09-26 MED ORDER — TETRACAINE HCL 0.5 % OP SOLN: 1.0000 [drp] | OPHTHALMIC | Status: DC

## 2016-09-26 MED ORDER — CYCLOPENTOLATE-PHENYLEPHRINE 0.2-1 % OP SOLN
1.0000 [drp] | OPHTHALMIC | Status: AC
  Administered 2016-09-26 (×3): 1 [drp] via OPHTHALMIC

## 2016-09-26 NOTE — Op Note (Signed)
Jolicia Delira T. Gershon Crane, MD  Procedure: Yag Capsulotomy  Yag Laser Self Test Completedyes. Procedure: Posterior Capsulotomy, Eye Protection Worn by Staff yes. Laser In Use Sign on Door yes.  Laser: Nd:YAG Spot Size: Fixed Burst Mode: III Power Setting: 3.4 mJ/burst Number of shots: 25 Total energy delivered: 77.93 mJ   The patient tolerated the procedure without difficulty. No complications were encountered.   The patient was discharged home with the instructions to continue all her current glaucoma medications, if any.   Patient instructed to go to office at 0100 for intraocular pressure check.  Patient verbalizes understanding of discharge instructions Yes.  .    Notes:  Pre-Operative Diagnosis: After-Cataract, obscuring vision, 366.53 OS Post-Operative Diagnosis: After-Cataract, obscuring vision, 366.53 OS Date of Cataract Surgery: 11/19/2013

## 2016-09-26 NOTE — Discharge Instructions (Signed)
ESA SANDERLIN  09/26/2016     Instructions    Activity: No Restrictions.   Diet: Resume Diet you were on at home.   Pain Medication: Tylenol if Needed.   CONTACT YOUR DOCTOR IF YOU HAVE PAIN, REDNESS IN YOUR EYE, OR DECREASED VISION.   Follow-up:today with Rutherford Guys, MD.   Dr. Gershon Crane: [phone]  Dr. Iona HansenJI:[id number]  Dr. Geoffry ParadiseID:[id number]   If you find that you cannot contact your physician, but feel that your signs and   Symptoms warrant a physician's attention, call the Emergency Room at   [phone] ext.[phone].   Othern/a.   FOLLOW UP TODAY WITH DR Gershon Crane AT 1:00 PM AT HIS OFFICE.

## 2016-09-26 NOTE — H&P (Signed)
The patient was re examined and there is no change in the patients condition since the original H and P. 

## 2016-09-27 ENCOUNTER — Encounter (HOSPITAL_COMMUNITY): Payer: Self-pay | Admitting: Ophthalmology

## 2017-10-02 ENCOUNTER — Ambulatory Visit (HOSPITAL_COMMUNITY)
Admission: RE | Admit: 2017-10-02 | Discharge: 2017-10-02 | Disposition: A | Discharge status: Home / Self Care | Payer: Medicare Other | Source: Ambulatory Visit | Attending: Internal Medicine | Admitting: Internal Medicine

## 2017-10-02 ENCOUNTER — Other Ambulatory Visit (HOSPITAL_COMMUNITY): Payer: Self-pay | Admitting: Internal Medicine

## 2017-10-02 DIAGNOSIS — M545 Low back pain: Secondary | ICD-10-CM | POA: Diagnosis present

## 2017-10-02 DIAGNOSIS — M4807 Spinal stenosis, lumbosacral region: Secondary | ICD-10-CM | POA: Insufficient documentation

## 2017-10-02 DIAGNOSIS — M48061 Spinal stenosis, lumbar region without neurogenic claudication: Secondary | ICD-10-CM | POA: Insufficient documentation

## 2018-01-17 ENCOUNTER — Encounter: Payer: Self-pay | Admitting: Internal Medicine

## 2018-02-26 ENCOUNTER — Other Ambulatory Visit: Payer: Self-pay

## 2018-02-26 ENCOUNTER — Encounter (HOSPITAL_COMMUNITY): Payer: Self-pay | Admitting: Physical Therapy

## 2018-02-26 ENCOUNTER — Ambulatory Visit (HOSPITAL_COMMUNITY): Payer: Medicare Other | Attending: Internal Medicine | Admitting: Physical Therapy

## 2018-02-26 DIAGNOSIS — G8929 Other chronic pain: Secondary | ICD-10-CM

## 2018-02-26 DIAGNOSIS — M545 Low back pain: Secondary | ICD-10-CM | POA: Diagnosis present

## 2018-02-26 DIAGNOSIS — R29898 Other symptoms and signs involving the musculoskeletal system: Secondary | ICD-10-CM | POA: Diagnosis present

## 2018-02-26 DIAGNOSIS — M6281 Muscle weakness (generalized): Secondary | ICD-10-CM | POA: Insufficient documentation

## 2018-02-26 NOTE — Therapy (Signed)
[place], [place], [address] Phone: [phone]   Fax:  [phone]  Physical Therapy Evaluation  Patient Details  Name: Erik Murray MRN: [id number] Date of Birth: [birth year] Referring Provider: Asencion Noble MD   Encounter Date: 02/26/2018  PT End of Session - 02/26/18 1220    Visit Number  1    Number of Visits  13    Date for PT Re-Evaluation  04/09/18 Mini re-assess 03/19/18    Authorization Type  [employer] Medicare (based on medical necessity)    Authorization Time Period  02/26/18 - 04/09/18    Authorization - Visit Number  1    Authorization - Number of Visits  10    PT Start Time  1022    PT Stop Time  1110    PT Time Calculation (min)  48 min    Activity Tolerance  Patient tolerated treatment well;No increased pain    Behavior During Therapy  WFL for tasks assessed/performed       Past Medical History:  Diagnosis Date  . Arthritis   . Cancer [facility])    prostate  . Hx of adenomatous colonic polyps     Past Surgical History:  Procedure Laterality Date  . COLONOSCOPY  02/05/2012   ZJI:RCVELFY and rectal polyps/few diverticulosis (adenomas). next TCS 01/2015  . COLONOSCOPY N/A 02/10/2015   Procedure: COLONOSCOPY;  Surgeon: Daneil Dolin, MD;  Location: [facility];  Service: Endoscopy;  Laterality: N/A;  . EYE SURGERY Bilateral apirl, may 2013   cataract lens replacement  . LYMPHADENECTOMY Bilateral 10/01/2014   Procedure: LYMPHADENECTOMY;  Surgeon: Raynelle Bring, MD;  Location: [facility];  Service: Urology;  Laterality: Bilateral;  . PROSTATE BIOPSY  dec 2015  . ROBOT ASSISTED LAPAROSCOPIC RADICAL PROSTATECTOMY N/A 10/01/2014   Procedure: ROBOTIC ASSISTED LAPAROSCOPIC RADICAL PROSTATECTOMY LEVEL 2;  Surgeon: Raynelle Bring, MD;  Location: [facility];  Service: Urology;  Laterality: N/A;  . TONSILLECTOMY  as child  . TRIGGER FINGER RELEASE  10/10/2011   Procedure: RELEASE TRIGGER FINGER/A-1 PULLEY;  Surgeon:  Wynonia Sours, MD;  Location: [place];  Service: Orthopedics;  Laterality: Right;  right thumb  . TRIGGER FINGER RELEASE Right 05/23/2016   Procedure: RELEASE TRIGGER FINGER/A-1 PULLEY right index finger;  Surgeon: Daryll Brod, MD;  Location: [place];  Service: Orthopedics;  Laterality: Right;  FAB  . TRIGGER FINGER RELEASE Left 07/18/2016   Procedure: RELEASE TRIGGER FINGER/A-1 PULLEY, left index;  Surgeon: Daryll Brod, MD;  Location: [place];  Service: Orthopedics;  Laterality: Left;  FAB  . YAG LASER APPLICATION Right 08/14/7508   Procedure: YAG LASER APPLICATION;  Surgeon: Rutherford Guys, MD;  Location: [facility];  Service: Ophthalmology;  Laterality: Right;  . YAG LASER APPLICATION Left 2/58/5277   Procedure: YAG LASER APPLICATION;  Surgeon: Rutherford Guys, MD;  Location: [facility];  Service: Ophthalmology;  Laterality: Left;    There were no vitals filed for this visit.   Subjective Assessment - 02/26/18 1027    Subjective  Patient reported that he has had back pain that is chronic nagging pain which has been off and on for years. Patient reported that the pain increased in the fall and has been bad since. Patient indicated that the back pain is in his bilateral lower back. Patient denied any tingling or numbness. Patient denied any changes in bowel and bladder function. Patient reported that he had a prostatectomy in 2016. Patient  reported he has pain at night sometimes which keeps him up, but he can reposition and fall back asleep. Patient reported being in remission for 3.5 years from prostate cancer. Patient reported that his back pain has gotten up to a 7/10 over the last week.     Pertinent History  Prostatectomy 09/2014    Limitations  Sitting;Standing;Lifting    How long can you sit comfortably?  10-15 minutes    How long can you stand comfortably?  5-10 minutes    How long can you walk comfortably?  Not limited     Diagnostic tests  Lumbar  spine x-ray Disc Space narrowing at L4-5 and L5-S1 10/02/17    Patient Stated Goals  Get away from the constant pain    Currently in Pain?  Yes    Pain Score  2     Pain Location  Back    Pain Orientation  Lower    Pain Descriptors / Indicators  Dull    Pain Type  Chronic pain    Aggravating Factors   Sitting on a hard bench    Pain Relieving Factors  Heat, back compressions    Effect of Pain on Daily Activities  Moderately limited    Multiple Pain Sites  No         [facility] PT Assessment - 02/26/18 0001      Assessment   Medical Diagnosis  Lumbar Disc Disease - Back Pain    Referring Provider  Asencion Noble MD    Next MD Visit  -- Unknown    Prior Therapy  Yes, for achilles tendon      Precautions   Precautions  None      Restrictions   Weight Bearing Restrictions  No      Balance Screen   Has the patient fallen in the past 6 months  No    Has the patient had a decrease in activity level because of a fear of falling?   No    Is the patient reluctant to leave their home because of a fear of falling?   No      Home Environment   Living Environment  Private residence    Living Arrangements  Spouse/significant other    Type of [place] to enter    Entrance Stairs-Number of Steps  15    Entrance Stairs-Rails  Left going up    [place]    Alternate Level Stairs-Number of Steps  12    Alternate Level Stairs-Rails  Can reach both      Prior Function   Level of Independence  Independent;Independent with basic ADLs    Vocation  Retired;Part time employment    Vocation Requirements  [place] Reeks a lot of sitting    [place], motorcycle      Cognition   Overall Cognitive Status  Within Functional Limits for tasks assessed      Observation/Other Assessments   Observations  Increased lumbar lordosis in standing    Focus on Therapeutic Outcomes (FOTO)   67% (33% limited)      Sensation   Light Touch  Not tested Patient denied any  tingling or numbness      Functional Tests   Functional tests  Step down;Other      Step Down   Comments  Patient demonstrated use of bilateral upper extremities with step downs x 5 each lower extremity  Other:   Other/ Comments  Floor to waist lifting 5# box with good form, overhead lift of 5# box increased use of back for performance (patient denied pain)      AROM   Lumbar Flexion  25% limited increases pain     Lumbar Extension  25% limited Increases pain but less so than flexion    Lumbar - Right Side Bend  25% limited  Increased pain    Lumbar - Left Side Bend  Not limited     Lumbar - Right Rotation  Not limited    Lumbar - Left Rotation  Not limited      Strength   Overall Strength Comments  4+/5 lumbar flexion    Right/Left Hip  Right;Left    Right Hip Flexion  4+/5    Right Hip Extension  4/5    Right Hip ABduction  4+/5    Left Hip Flexion  5/5    Left Hip Extension  4/5    Left Hip ABduction  4+/5    Right/Left Knee  Right;Left    Right Knee Flexion  5/5    Right Knee Extension  5/5    Left Knee Flexion  5/5    Left Knee Extension  5/5    Right/Left Ankle  Right;Left    Right Ankle Dorsiflexion  5/5    Right Ankle Plantar Flexion  5/5    Left Ankle Dorsiflexion  5/5    Left Ankle Plantar Flexion  5/5      Flexibility   Soft Tissue Assessment /Muscle Length  yes    Hamstrings  Minimally limited    Quadriceps  Hip flexors increased tightness with thomas test bilaterally    Piriformis  Increased tightness bilaterally      Palpation   Palpation comment  Patient reported tenderness to palpation of T10 and throughout lumbar spine, noted increased mobility of upper lumbar spine compared to lower lumbar spine. Patient also reported tenderness through right lumbar paraspinals and therapist noted increased muscular restrictions with this      Special Tests    Special Tests  Lumbar    Lumbar Tests  Slump Test      Slump test   Findings  Negative    Side  --  Bilaterally      Ambulation/Gait   Gait Comments  With ascending and descending 4 6-inch steps x 4 patient demonstrated good form with reciprocal gait pattern and without use of upper extremities      Balance   Balance Assessed  Yes      Static Standing Balance   Static Standing - Balance Support  No upper extremity supported    Static Standing Balance -  Activities   Single Leg Stance - Right Leg;Single Leg Stance - Left Leg    Static Standing - Comment/# of Minutes  Firm surface: SLS Lt. 26 seconds, SLS Rt. 22 seconds      Standardized Balance Assessment   Five times sit to stand comments   10.42 seconds without upper extremities                Objective measurements completed on examination: See above findings.              PT Education - 02/26/18 1220    Education Details  Patient was educated on examination findings, POC, and HEP.     Person(s) Educated  Patient    Methods  Explanation;Handout    Comprehension  Verbalized understanding  PT Short Term Goals - 02/26/18 1222      PT SHORT TERM GOAL #1   Title  Patient will report understanding and regular compliance with HEP in order to return to PLOF.     Time  3    Period  Weeks    Status  New    Target Date  03/19/18      PT SHORT TERM GOAL #2   Title  Patient will demonstrate improvement of 1/2 MMT grade strength in all deficient muscle groups to help patient perform functional activities without pain.     Time  3    Period  Weeks    Status  New    Target Date  03/19/18      PT SHORT TERM GOAL #3   Title  Patient will report that his pain has not exceeded a 5/10 over the course of a 1 week period indicating improved tolerance to functional activities.     Time  3    Period  Weeks    Status  New    Target Date  03/19/18        PT Long Term Goals - 02/26/18 1224      PT LONG TERM GOAL #1   Title  Patient will demonstrate improvement of 1 MMT grade strength in all deficient muscle  groups to help patient perform functional activities without pain.     Time  6    Period  Weeks    Status  New    Target Date  04/09/18      PT LONG TERM GOAL #2   Title  Patient will demonstrate ability to maintain single limb stance on each lower extremity for at least 30 seconds without compensations indicating improved stability and balance.     Time  6    Period  Weeks    Status  New    Target Date  04/09/18      PT LONG TERM GOAL #3   Title  Patient will report that his pain has not exceeded a 2/10 over the course of a 1 week period indicating improved tolerance to functional activities.     Time  6    Period  Weeks    Status  New    Target Date  04/09/18      PT LONG TERM GOAL #4   Title  Patient will deny tenderness to palpation of lumbar paraspinals with minimal to know muscular restrictions felt throughout.     Time  6    Period  Weeks    Status  New    Target Date  04/09/18             Plan - 02/26/18 1232    Clinical Impression Statement  Patient is a 72 year old male who presented to physical therapy with complaints of chronic low back pain. Upon examination, noted decreased strength of patient's abdominals and lower extremities. Noted some decrease in lumbar ROM as well as increased pain with movement particularly of lumbar flexion. Also noted, with palpation increased pain particularly around the lumbar paraspinals as well as increased muscular restrictions of lumbar paraspinals particularly on the right side. With single limb balance patient was unable to maintain single limb stance for 30 seconds on either lower extremity. Patient demonstrated slight deviations with step-downs and with lifting activities and would benefit form some education on this. With stair ambulation patient demonstrated good form. Patient performed the 5xSTS in an appropriate time  for his age level. Patient would benefit from skilled physical therapy in order to address the abovementioned  deficits in order to assist patient to returning to prior level of function.     History and Personal Factors relevant to plan of care:  History of prostate cancer in remission from 2016 with prostateectomy     Clinical Presentation  Stable    Clinical Presentation due to:  FOTO, 5xSTS, MMT, clinical judgement    Clinical Decision Making  Low    Rehab Potential  Good    Clinical Impairments Affecting Rehab Potential  Positive: Active, motivated. Negative: Chronicity of issue    PT Frequency  2x / week    PT Duration  6 weeks    PT Treatment/Interventions  ADLs/Self Care Home Management;Aquatic Therapy;Cryotherapy;Gait training;Stair training;Functional mobility training;Therapeutic activities;Therapeutic exercise;Balance training;Neuromuscular re-education;Patient/family education;Manual techniques;Passive range of motion;Moist Heat;Dry needling;Energy conservation;Taping    PT Next Visit Plan  Review HEP, goals, and evalution. Initiate further core and lumbar strengthening exercises as well as continue stretching hip flexors and piriformis muscle. Initiate functional lower extremity strengthening and begin balance activities when able.     PT Home Exercise Plan  02/26/18: Piriformis stretch 3 x 30 seconds each leg 1x/day, Hip flexor stretch 3 x 30 seconds each leg 1x/day, Posterior pelvic tilt x 15 5 second holds 1x/day, TA activation 10 x 10 second holds and TA activation with marching x 20 with 3'' holds 1x/day     Consulted and Agree with Plan of Care  Patient       Patient will benefit from skilled therapeutic intervention in order to improve the following deficits and impairments:  Decreased balance, Decreased mobility, Improper body mechanics, Decreased activity tolerance, Decreased strength, Increased fascial restricitons, Impaired flexibility, Postural dysfunction, Pain  Visit Diagnosis: Chronic bilateral low back pain without sciatica  Muscle weakness (generalized)  Other symptoms and  signs involving the musculoskeletal system     Problem List Patient Active Problem List   Diagnosis Date Noted  . History of colonic polyps   . Hx of adenomatous colonic polyps 01/06/2015  . Prostate cancer ([place]) 10/01/2014   Clarene Critchley PT, DPT 12:38 PM, 02/26/18 [place] [address] [place], [place], [address] Phone: [phone]   Fax:  [phone]  Name: Erik Murray MRN: [id number] Date of Birth: 02-06-1946

## 2018-02-26 NOTE — Patient Instructions (Signed)
  PIRIFORMIS STRETCH While lying on your back with both knee bent, cross your affected leg on the other knee. Next, hold your unaffected thigh and pull it up towards your chest until a stretch is felt in the buttock. Repeat 3 Times Hold 30 Seconds Complete 1 Set Perform 1 Time(s) a Day   Transverse Abdominus Activation Start with TA activation 10 second x 10 repetitions. Then Start Contract your lower abdominals as if you were trying to lift one leg from the table. Initiate the movement but do no lift foot greater than 1 inch from the table. Repeat opposite side.  Repeat 20 Times Hold 3 Seconds Complete 1 Set   POSTERIOR PELVIC TILT Lie on your back on a firm surface with knees comfortably bent (top picture). Then flatten back against the table while contracting abdominal muscles as if pulling belly button toward ribs (bottom picture). Repeat 15 Times Hold 5 Seconds Complete 1 Set Perform 1 Time(s) a Day   HIP FLEXOR STRETCH While lying on a table or high bed, let the affected leg lower towards the floor until a stretch is felt along the front of your thigh.  Repeat 3 Times Hold 30 Seconds Complete 1 Set Perform 1 Time(s) a Day

## 2018-03-01 ENCOUNTER — Encounter (HOSPITAL_COMMUNITY): Payer: Self-pay

## 2018-03-01 ENCOUNTER — Ambulatory Visit (HOSPITAL_COMMUNITY): Payer: Medicare Other

## 2018-03-01 DIAGNOSIS — M545 Low back pain: Secondary | ICD-10-CM | POA: Diagnosis not present

## 2018-03-01 DIAGNOSIS — R29898 Other symptoms and signs involving the musculoskeletal system: Secondary | ICD-10-CM

## 2018-03-01 DIAGNOSIS — G8929 Other chronic pain: Secondary | ICD-10-CM

## 2018-03-01 DIAGNOSIS — M6281 Muscle weakness (generalized): Secondary | ICD-10-CM

## 2018-03-01 NOTE — Therapy (Signed)
[place], [place], [address] Phone: [phone]   Fax:  [phone]  Physical Therapy Treatment  Patient Details  Name: Erik Murray MRN: [id number] Date of Birth: 06-10-1946 Referring Provider: Asencion Noble MD   Encounter Date: 03/01/2018  PT End of Session - 03/01/18 0819    Visit Number  2    Number of Visits  13    Date for PT Re-Evaluation  04/09/18 Mini re-assess 03/19/18    Authorization Type  [employer] Medicare (based on medical necessity)    Authorization Time Period  02/26/18 - 04/09/18    Authorization - Visit Number  2    Authorization - Number of Visits  10    PT Start Time  0816    PT Stop Time  0856    PT Time Calculation (min)  40 min    Activity Tolerance  Patient tolerated treatment well;No increased pain    Behavior During Therapy  WFL for tasks assessed/performed       Past Medical History:  Diagnosis Date  . Arthritis   . Cancer [facility])    prostate  . Hx of adenomatous colonic polyps     Past Surgical History:  Procedure Laterality Date  . COLONOSCOPY  02/05/2012   ONG:[id number] and rectal polyps/few diverticulosis (adenomas). next TCS 01/2015  . COLONOSCOPY N/A 02/10/2015   Procedure: COLONOSCOPY;  Surgeon: Daneil Dolin, MD;  [facility];  Service: Endoscopy;  Laterality: N/A;  . EYE SURGERY Bilateral apirl, may 2013   cataract lens replacement  . LYMPHADENECTOMY Bilateral 10/01/2014   Procedure: LYMPHADENECTOMY;  Surgeon: Raynelle Bring, MD;  [facility]: [facility];  Service: Urology;  Laterality: Bilateral;  . PROSTATE BIOPSY  dec 2015  . ROBOT ASSISTED LAPAROSCOPIC RADICAL PROSTATECTOMY N/A 10/01/2014   Procedure: ROBOTIC ASSISTED LAPAROSCOPIC RADICAL PROSTATECTOMY LEVEL 2;  Surgeon: Raynelle Bring, MD;  [facility];  Service: Urology;  Laterality: N/A;  . TONSILLECTOMY  as child  . TRIGGER FINGER RELEASE  10/10/2011   Procedure: RELEASE TRIGGER FINGER/A-1 PULLEY;  Surgeon:  Wynonia Sours, MD;  Location: [place];  Service: Orthopedics;  Laterality: Right;  right thumb  . TRIGGER FINGER RELEASE Right 05/23/2016   Procedure: RELEASE TRIGGER FINGER/A-1 PULLEY right index finger;  Surgeon: Daryll Brod, MD;  Location: [place];  Service: Orthopedics;  Laterality: Right;  FAB  . TRIGGER FINGER RELEASE Left 07/18/2016   Procedure: RELEASE TRIGGER FINGER/A-1 PULLEY, left index;  Surgeon: Daryll Brod, MD;  Location: [place];  Service: Orthopedics;  Laterality: Left;  FAB  . YAG LASER APPLICATION Right 11/04/4008   Procedure: YAG LASER APPLICATION;  Surgeon: Rutherford Guys, MD;  [facility];  Service: Ophthalmology;  Laterality: Right;  . YAG LASER APPLICATION Left 2/72/5366   Procedure: YAG LASER APPLICATION;  Surgeon: Rutherford Guys, MD;  [facility];  Service: Ophthalmology;  Laterality: Left;    There were no vitals filed for this visit.  Subjective Assessment - 03/01/18 0818    Subjective  Pt reports compliance with HEP and feels like they are helping some. He is having a little bit of pain currently but nothing he can't deal with at the moment.    Pertinent History  Prostatectomy 09/2014    Limitations  Sitting;Standing;Lifting    How long can you sit comfortably?  10-15 minutes    How long can you stand comfortably?  5-10 minutes    How long  can you walk comfortably?  Not limited     Diagnostic tests  Lumbar spine x-ray Disc Space narrowing at L4-5 and L5-S1 10/02/17    Patient Stated Goals  Get away from the constant pain    Currently in Pain?  Yes    Pain Score  2     Pain Location  Back    Pain Orientation  Lower    Pain Descriptors / Indicators  Dull    Pain Type  Chronic pain    Pain Onset  More than a month ago    Pain Frequency  Constant    Aggravating Factors   sitting on a hard bench    Pain Relieving Factors  heat, back compressions    Effect of Pain on Daily Activities  moderately limited              [facility] Adult PT Treatment/Exercise - 03/01/18 0001      Exercises   Exercises  Lumbar      Lumbar Exercises: Stretches   Single Knee to Chest Stretch  Right;Left;2 reps;30 seconds    Double Knee to Chest Stretch  2 reps;30 seconds    Lower Trunk Rotation  5 reps;10 seconds    Quad Stretch  Right;Left;2 reps;30 seconds    Quad Stretch Limitations  prone quad/hip flexor stretch with rope      Lumbar Exercises: Standing   Shoulder Adduction Limitations  bil hip abd and hip diagonals GTB x15 reps each    Other Standing Lumbar Exercises  sidestepping GTB x2RT    Other Standing Lumbar Exercises  SLS vectors firm x5RT each      Lumbar Exercises: Supine   Bridge  15 reps    Bridge Limitations  with bil ankle DF to increase posterior chain      Lumbar Exercises: Sidelying   Hip Abduction  Both;15 reps      Manual Therapy   Manual Therapy  Soft tissue mobilization    Manual therapy comments  completed separate rest of treatment    Soft tissue mobilization  instrument-assisted STM to R lumbar paraspinals to decrease pain and restrictions             PT Education - 03/01/18 0820    Education Details  reviewed goals and ensured understanding of HEP; issued copy of eval; exercise technqiue, continue HEP    Person(s) Educated  Patient    Methods  Explanation;Demonstration;Handout    Comprehension  Verbalized understanding;Returned demonstration       PT Short Term Goals - 02/26/18 1222      PT SHORT TERM GOAL #1   Title  Patient will report understanding and regular compliance with HEP in order to return to PLOF.     Time  3    Period  Weeks    Status  New    Target Date  03/19/18      PT SHORT TERM GOAL #2   Title  Patient will demonstrate improvement of 1/2 MMT grade strength in all deficient muscle groups to help patient perform functional activities without pain.     Time  3    Period  Weeks    Status  New    Target Date  03/19/18      PT SHORT TERM  GOAL #3   Title  Patient will report that his pain has not exceeded a 5/10 over the course of a 1 week period indicating improved tolerance to functional activities.  Time  3    Period  Weeks    Status  New    Target Date  03/19/18        PT Long Term Goals - 02/26/18 1224      PT LONG TERM GOAL #1   Title  Patient will demonstrate improvement of 1 MMT grade strength in all deficient muscle groups to help patient perform functional activities without pain.     Time  6    Period  Weeks    Status  New    Target Date  04/09/18      PT LONG TERM GOAL #2   Title  Patient will demonstrate ability to maintain single limb stance on each lower extremity for at least 30 seconds without compensations indicating improved stability and balance.     Time  6    Period  Weeks    Status  New    Target Date  04/09/18      PT LONG TERM GOAL #3   Title  Patient will report that his pain has not exceeded a 2/10 over the course of a 1 week period indicating improved tolerance to functional activities.     Time  6    Period  Weeks    Status  New    Target Date  04/09/18      PT LONG TERM GOAL #4   Title  Patient will deny tenderness to palpation of lumbar paraspinals with minimal to know muscular restrictions felt throughout.     Time  6    Period  Weeks    Status  New    Target Date  04/09/18            Plan - 03/01/18 0856    Clinical Impression Statement  Began session by reviewing goals and issuing copy of eval; no f/u questions afterwards. Pt had no questions or concerns regarding his HEP. Focused today's session on general mobility, flexibility, and strengthening.  Only minor discomfort noted during bridging on R side of lower back but able to decrease with exercise modification. Pt tolerated session well, not reporting any increased pain. PT initiated manual therapy this date to address soft tissue restrictions in lumbar paraspinals. Pt reporting that the manual really helped  loosen him up and reported 1/10 pain at EOS. Continue as planned, progressing as able.     Rehab Potential  Good    Clinical Impairments Affecting Rehab Potential  Positive: Active, motivated. Negative: Chronicity of issue    PT Frequency  2x / week    PT Duration  6 weeks    PT Treatment/Interventions  ADLs/Self Care Home Management;Aquatic Therapy;Cryotherapy;Gait training;Stair training;Functional mobility training;Therapeutic activities;Therapeutic exercise;Balance training;Neuromuscular re-education;Patient/family education;Manual techniques;Passive range of motion;Moist Heat;Dry needling;Energy conservation;Taping    PT Next Visit Plan  cotninue core and lumbar strengthening exercises as well as continue stretching hip flexors and piriformis muscle,  functional lower extremity strengthening, balance training, and continue manual STM    PT Home Exercise Plan  02/26/18: Piriformis stretch 3 x 30 seconds each leg 1x/day, Hip flexor stretch 3 x 30 seconds each leg 1x/day, Posterior pelvic tilt x 15 5 second holds 1x/day, TA activation 10 x 10 second holds and TA activation with marching x 20 with 3'' holds 1x/day     Consulted and Agree with Plan of Care  Patient       Patient will benefit from skilled therapeutic intervention in order to improve the following deficits and impairments:  Decreased  balance, Decreased mobility, Improper body mechanics, Decreased activity tolerance, Decreased strength, Increased fascial restricitons, Impaired flexibility, Postural dysfunction, Pain  Visit Diagnosis: Chronic bilateral low back pain without sciatica  Muscle weakness (generalized)  Other symptoms and signs involving the musculoskeletal system     Problem List Patient Active Problem List   Diagnosis Date Noted  . History of colonic polyps   . Hx of adenomatous colonic polyps 01/06/2015  . Prostate cancer ([place]) 10/01/2014       Geraldine Solar PT, [place] [address] [place], [place], [address] Phone: [phone]   Fax:  [phone]  Name: SEICHI KAUFHOLD MRN: [id number] Date of Birth: 1946-06-20

## 2018-03-04 ENCOUNTER — Ambulatory Visit (HOSPITAL_COMMUNITY): Payer: Medicare Other | Admitting: Physical Therapy

## 2018-03-04 ENCOUNTER — Encounter (HOSPITAL_COMMUNITY): Payer: Self-pay | Admitting: Physical Therapy

## 2018-03-04 DIAGNOSIS — M545 Low back pain, unspecified: Secondary | ICD-10-CM

## 2018-03-04 DIAGNOSIS — R29898 Other symptoms and signs involving the musculoskeletal system: Secondary | ICD-10-CM

## 2018-03-04 DIAGNOSIS — G8929 Other chronic pain: Secondary | ICD-10-CM

## 2018-03-04 DIAGNOSIS — M6281 Muscle weakness (generalized): Secondary | ICD-10-CM

## 2018-03-04 NOTE — Therapy (Signed)
[place], [place], [address] Phone: [phone]   Fax:  [phone]  Physical Therapy Treatment  Patient Details  Name: Erik Murray MRN: [id number] Date of Birth: 10/23/1945 Referring Provider: Asencion Noble MD   Encounter Date: 03/04/2018  PT End of Session - 03/04/18 0819    Visit Number  3    Number of Visits  13    Date for PT Re-Evaluation  04/09/18 Mini re-assess 03/19/18    Authorization Type  [employer] Medicare (based on medical necessity)    Authorization Time Period  02/26/18 - 04/09/18    Authorization - Visit Number  3    Authorization - Number of Visits  10    PT Start Time  0816    PT Stop Time  7939    PT Time Calculation (min)  39 min    Activity Tolerance  Patient tolerated treatment well;No increased pain    Behavior During Therapy  WFL for tasks assessed/performed       Past Medical History:  Diagnosis Date  . Arthritis   . Cancer [facility])    prostate  . Hx of adenomatous colonic polyps     Past Surgical History:  Procedure Laterality Date  . COLONOSCOPY  02/05/2012   QZE:[id number] and rectal polyps/few diverticulosis (adenomas). next TCS 01/2015  . COLONOSCOPY N/A 02/10/2015   Procedure: COLONOSCOPY;  Surgeon: Daneil Dolin, MD;  [facility];  Service: Endoscopy;  Laterality: N/A;  . EYE SURGERY Bilateral apirl, may 2013   cataract lens replacement  . LYMPHADENECTOMY Bilateral 10/01/2014   Procedure: LYMPHADENECTOMY;  Surgeon: Raynelle Bring, MD;  [facility];  Service: Urology;  Laterality: Bilateral;  . PROSTATE BIOPSY  dec 2015  . ROBOT ASSISTED LAPAROSCOPIC RADICAL PROSTATECTOMY N/A 10/01/2014   Procedure: ROBOTIC ASSISTED LAPAROSCOPIC RADICAL PROSTATECTOMY LEVEL 2;  Surgeon: Raynelle Bring, MD;  [facility];  Service: Urology;  Laterality: N/A;  . TONSILLECTOMY  as child  . TRIGGER FINGER RELEASE  10/10/2011   Procedure: RELEASE TRIGGER FINGER/A-1 PULLEY;  Surgeon:  Wynonia Sours, MD;  Location: [place];  Service: Orthopedics;  Laterality: Right;  right thumb  . TRIGGER FINGER RELEASE Right 05/23/2016   Procedure: RELEASE TRIGGER FINGER/A-1 PULLEY right index finger;  Surgeon: Daryll Brod, MD;  Location: [place];  Service: Orthopedics;  Laterality: Right;  FAB  . TRIGGER FINGER RELEASE Left 07/18/2016   Procedure: RELEASE TRIGGER FINGER/A-1 PULLEY, left index;  Surgeon: Daryll Brod, MD;  Location: [place];  Service: Orthopedics;  Laterality: Left;  FAB  . YAG LASER APPLICATION Right 7/62/2633   Procedure: YAG LASER APPLICATION;  Surgeon: Rutherford Guys, MD;  [facility];  Service: Ophthalmology;  Laterality: Right;  . YAG LASER APPLICATION Left 3/54/5625   Procedure: YAG LASER APPLICATION;  Surgeon: Rutherford Guys, MD;  [facility];  Service: Ophthalmology;  Laterality: Left;    There were no vitals filed for this visit.  Subjective Assessment - 03/04/18 0818    Subjective  Patient stated he has been doing his HEP. He said he is having his usual back pain right now.     Pertinent History  Prostatectomy 09/2014    Limitations  Sitting;Standing;Lifting    How long can you sit comfortably?  10-15 minutes    How long can you stand comfortably?  5-10 minutes    How long can you walk comfortably?  Not limited  Diagnostic tests  Lumbar spine x-ray Disc Space narrowing at L4-5 and L5-S1 10/02/17    Patient Stated Goals  Get away from the constant pain    Currently in Pain?  Yes    Pain Score  2     Pain Location  Back    Pain Orientation  Lower    Pain Descriptors / Indicators  Dull    Pain Type  Chronic pain    Pain Onset  More than a month ago                       [facility] Adult PT Treatment/Exercise - 03/04/18 0001      Lumbar Exercises: Stretches   Single Knee to Chest Stretch  Right;Left;2 reps;30 seconds    Double Knee to Chest Stretch  2 reps;30 seconds    Lower Trunk  Rotation  5 reps;10 seconds    Quad Stretch  Right;Left;2 reps;30 seconds    Quad Stretch Limitations  prone quad/hip flexor stretch with rope      Lumbar Exercises: Standing   Shoulder Adduction Limitations  Bilateral hip abduction and hip diagonal with green theraband x 15    Other Standing Lumbar Exercises  Palloff press with semi-tandem RTB x 20 each LE forward. sidestepping GTB 14 feet x2RT    Other Standing Lumbar Exercises  SLS vectors firm forward side and back 5'' holds x5RT each      Lumbar Exercises: Supine   Bridge  15 reps    Bridge Limitations  with bil ankle DF to increase posterior chain      Lumbar Exercises: Sidelying   Hip Abduction  Both;15 reps;Limitations    Hip Abduction Limitations  Slight hip extension      Manual Therapy   Manual Therapy  Soft tissue mobilization    Manual therapy comments  completed separate rest of treatment    Soft tissue mobilization  STM to Right lumbar paraspinals to decrease pain and restrictions. Patient prone.             PT Education - 03/04/18 0818    Education Details  Educated patient on purspoe and technique of exercises throughout session.     Person(s) Educated  Patient    Methods  Explanation    Comprehension  Verbalized understanding       PT Short Term Goals - 03/04/18 0856      PT SHORT TERM GOAL #1   Title  Patient will report understanding and regular compliance with HEP in order to return to PLOF.     Time  3    Period  Weeks    Status  On-going      PT SHORT TERM GOAL #2   Title  Patient will demonstrate improvement of 1/2 MMT grade strength in all deficient muscle groups to help patient perform functional activities without pain.     Time  3    Period  Weeks    Status  On-going      PT SHORT TERM GOAL #3   Title  Patient will report that his pain has not exceeded a 5/10 over the course of a 1 week period indicating improved tolerance to functional activities.     Time  3    Period  Weeks     Status  On-going        PT Long Term Goals - 03/04/18 0857      PT LONG TERM GOAL #1   Title  Patient will demonstrate improvement of 1 MMT grade strength in all deficient muscle groups to help patient perform functional activities without pain.     Time  6    Period  Weeks    Status  On-going      PT LONG TERM GOAL #2   Title  Patient will demonstrate ability to maintain single limb stance on each lower extremity for at least 30 seconds without compensations indicating improved stability and balance.     Time  6    Period  Weeks    Status  On-going      PT LONG TERM GOAL #3   Title  Patient will report that his pain has not exceeded a 2/10 over the course of a 1 week period indicating improved tolerance to functional activities.     Time  6    Period  Weeks    Status  On-going      PT LONG TERM GOAL #4   Title  Patient will deny tenderness to palpation of lumbar paraspinals with minimal to know muscular restrictions felt throughout.     Time  6    Period  Weeks    Status  On-going            Plan - 03/04/18 9562    Clinical Impression Statement  This session continued to progress patient with exercises to improve lumbar mobility and abdominal stability. This session added palloff press to improve abdominal strengthening and stability. Patient tolerated this exercise well in a semi-tandem position. Patient reported positive response to manual therapy and continued this session with noted decrease in tissue turgor following manual therapy and patient reported decrease in pain. Plan to continue with established plan of care to progress toward functional goals.     Rehab Potential  Good    Clinical Impairments Affecting Rehab Potential  Positive: Active, motivated. Negative: Chronicity of issue    PT Frequency  2x / week    PT Duration  6 weeks    PT Treatment/Interventions  ADLs/Self Care Home Management;Aquatic Therapy;Cryotherapy;Gait training;Stair training;Functional  mobility training;Therapeutic activities;Therapeutic exercise;Balance training;Neuromuscular re-education;Patient/family education;Manual techniques;Passive range of motion;Moist Heat;Dry needling;Energy conservation;Taping    PT Next Visit Plan  cotninue core and lumbar strengthening exercises as well as continue stretching hip flexors and piriformis muscle,  functional lower extremity strengthening, balance training, and continue manual STM    PT Home Exercise Plan  02/26/18: Piriformis stretch 3 x 30 seconds each leg 1x/day, Hip flexor stretch 3 x 30 seconds each leg 1x/day, Posterior pelvic tilt x 15 5 second holds 1x/day, TA activation 10 x 10 second holds and TA activation with marching x 20 with 3'' holds 1x/day     Consulted and Agree with Plan of Care  Patient       Patient will benefit from skilled therapeutic intervention in order to improve the following deficits and impairments:  Decreased balance, Decreased mobility, Improper body mechanics, Decreased activity tolerance, Decreased strength, Increased fascial restricitons, Impaired flexibility, Postural dysfunction, Pain  Visit Diagnosis: Chronic bilateral low back pain without sciatica  Muscle weakness (generalized)  Other symptoms and signs involving the musculoskeletal system     Problem List Patient Active Problem List   Diagnosis Date Noted  . History of colonic polyps   . Hx of adenomatous colonic polyps 01/06/2015  . Prostate cancer ([place]) 10/01/2014   Clarene Critchley PT, DPT 8:58 AM, 03/04/18 [place],  [place], [address] Phone: [phone]   Fax:  [phone]  Name: Erik Murray MRN: [id number] Date of Birth: [birth year]

## 2018-03-08 ENCOUNTER — Ambulatory Visit (HOSPITAL_COMMUNITY): Payer: Medicare Other

## 2018-03-08 ENCOUNTER — Encounter (HOSPITAL_COMMUNITY): Payer: Self-pay

## 2018-03-08 DIAGNOSIS — M545 Low back pain: Secondary | ICD-10-CM | POA: Diagnosis not present

## 2018-03-08 DIAGNOSIS — R29898 Other symptoms and signs involving the musculoskeletal system: Secondary | ICD-10-CM

## 2018-03-08 DIAGNOSIS — M6281 Muscle weakness (generalized): Secondary | ICD-10-CM

## 2018-03-08 DIAGNOSIS — G8929 Other chronic pain: Secondary | ICD-10-CM

## 2018-03-08 NOTE — Therapy (Signed)
[place], [place], [address] Phone: [phone]   Fax:  [phone]  Physical Therapy Treatment  Patient Details  Name: Erik Murray MRN: [id number] Date of Birth: 07/18/1946 Referring Provider: Asencion Noble MD   Encounter Date: 03/08/2018  PT End of Session - 03/08/18 0825    Visit Number  4    Number of Visits  13    Date for PT Re-Evaluation  04/09/18    Authorization Type  [employer] Medicare (based on medical necessity); reassessment on 03/19/18, reevaluation on 04/09/18    Authorization Time Period  02/26/18 - 04/09/18    Authorization - Visit Number  4    Authorization - Number of Visits  10    PT Start Time  0816    PT Stop Time  0856    PT Time Calculation (min)  40 min    Activity Tolerance  Patient tolerated treatment well;No increased pain    Behavior During Therapy  WFL for tasks assessed/performed       Past Medical History:  Diagnosis Date  . Arthritis   . Cancer [facility])    prostate  . Hx of adenomatous colonic polyps     Past Surgical History:  Procedure Laterality Date  . COLONOSCOPY  02/05/2012   ONG:EXBMWUX and rectal polyps/few diverticulosis (adenomas). next TCS 01/2015  . COLONOSCOPY N/A 02/10/2015   Procedure: COLONOSCOPY;  Surgeon: Daneil Dolin, MD;  [facility];  Service: Endoscopy;  Laterality: N/A;  . EYE SURGERY Bilateral apirl, may 2013   cataract lens replacement  . LYMPHADENECTOMY Bilateral 10/01/2014   Procedure: LYMPHADENECTOMY;  Surgeon: Raynelle Bring, MD;  [facility]: [facility];  Service: Urology;  Laterality: Bilateral;  . PROSTATE BIOPSY  dec 2015  . ROBOT ASSISTED LAPAROSCOPIC RADICAL PROSTATECTOMY N/A 10/01/2014   Procedure: ROBOTIC ASSISTED LAPAROSCOPIC RADICAL PROSTATECTOMY LEVEL 2;  Surgeon: Raynelle Bring, MD;  [facility];  Service: Urology;  Laterality: N/A;  . TONSILLECTOMY  as child  . TRIGGER FINGER RELEASE  10/10/2011   Procedure: RELEASE TRIGGER  FINGER/A-1 PULLEY;  Surgeon: Wynonia Sours, MD;  Location: [place];  Service: Orthopedics;  Laterality: Right;  right thumb  . TRIGGER FINGER RELEASE Right 05/23/2016   Procedure: RELEASE TRIGGER FINGER/A-1 PULLEY right index finger;  Surgeon: Daryll Brod, MD;  Location: [place];  Service: Orthopedics;  Laterality: Right;  FAB  . TRIGGER FINGER RELEASE Left 07/18/2016   Procedure: RELEASE TRIGGER FINGER/A-1 PULLEY, left index;  Surgeon: Daryll Brod, MD;  Location: [place];  Service: Orthopedics;  Laterality: Left;  FAB  . YAG LASER APPLICATION Right 11/04/4008   Procedure: YAG LASER APPLICATION;  Surgeon: Rutherford Guys, MD;  [facility];  Service: Ophthalmology;  Laterality: Right;  . YAG LASER APPLICATION Left 2/72/5366   Procedure: YAG LASER APPLICATION;  Surgeon: Rutherford Guys, MD;  [facility];  Service: Ophthalmology;  Laterality: Left;    There were no vitals filed for this visit.  Subjective Assessment - 03/08/18 0819    Subjective  HEP going well, defers quesitons. C/o muscle soreness in Rigth central lumbar region, but original pain is gone at this time.     Currently in Pain?  Yes    Pain Score  3     Pain Location  Back    Pain Orientation  Right;Lower    Pain Descriptors / Indicators  Sore  [place] Adult PT Treatment/Exercise - 03/08/18 0001      Lumbar Exercises: Stretches   Single Knee to Chest Stretch  Right;Left;2 reps;30 seconds    Double Knee to Chest Stretch  2 reps;30 seconds    Lower Trunk Rotation  Other (comment);Limitations in sidelying ( bilat) open book: 10x5secH    Hip Flexor Stretch  Left;Right;2 reps;30 seconds half kneeling psoas stretch    Quad Stretch  Right;Left;2 reps;30 seconds    Quad Stretch Limitations  prone quad/hip flexor stretch with rope      Lumbar Exercises: Standing   Other Standing Lumbar Exercises  Palloff press with semi-tandem RTB x 20 each LE  forward.  sidestepping GTB 30 feet x2RT ( bilat)     Other Standing Lumbar Exercises  SLS vector cone taps: 2x15 bilat VC to avoid single UE support      Lumbar Exercises: Seated   Sit to Stand  10 reps arm,s in shoulder flexion       Lumbar Exercises: Supine   Bridge  15 reps 2x15      Lumbar Exercises: Sidelying   Hip Abduction  Both;15 reps;Limitations 2x15 bilat, VC for slight hip extension    Hip Abduction Limitations  manual resistance for trunk stabilization                PT Short Term Goals - 03/04/18 0856      PT SHORT TERM GOAL #1   Title  Patient will report understanding and regular compliance with HEP in order to return to PLOF.     Time  3    Period  Weeks    Status  On-going      PT SHORT TERM GOAL #2   Title  Patient will demonstrate improvement of 1/2 MMT grade strength in all deficient muscle groups to help patient perform functional activities without pain.     Time  3    Period  Weeks    Status  On-going      PT SHORT TERM GOAL #3   Title  Patient will report that his pain has not exceeded a 5/10 over the course of a 1 week period indicating improved tolerance to functional activities.     Time  3    Period  Weeks    Status  On-going        PT Long Term Goals - 03/04/18 0857      PT LONG TERM GOAL #1   Title  Patient will demonstrate improvement of 1 MMT grade strength in all deficient muscle groups to help patient perform functional activities without pain.     Time  6    Period  Weeks    Status  On-going      PT LONG TERM GOAL #2   Title  Patient will demonstrate ability to maintain single limb stance on each lower extremity for at least 30 seconds without compensations indicating improved stability and balance.     Time  6    Period  Weeks    Status  On-going      PT LONG TERM GOAL #3   Title  Patient will report that his pain has not exceeded a 2/10 over the course of a 1 week period indicating improved tolerance to functional  activities.     Time  6    Period  Weeks    Status  On-going      PT LONG TERM GOAL #4   Title  Patient will  deny tenderness to palpation of lumbar paraspinals with minimal to know muscular restrictions felt throughout.     Time  6    Period  Weeks    Status  On-going            Plan - 03/08/18 1638    Clinical Impression Statement  Continued with current program. Pt demonstrating improved strength and tolerance to exercises, but ROM in knee and hip remain limited. Pain well controlled in session, no signficiant limitations. Added in SS with cone taps this date, as well as half kneeeling psoas stretch. Pt progressing well toward goals overall.     Rehab Potential  Good    Clinical Impairments Affecting Rehab Potential  Positive: Active, motivated. Negative: Chronicity of issue    PT Frequency  2x / week    PT Duration  6 weeks    PT Treatment/Interventions  ADLs/Self Care Home Management;Aquatic Therapy;Cryotherapy;Gait training;Stair training;Functional mobility training;Therapeutic activities;Therapeutic exercise;Balance training;Neuromuscular re-education;Patient/family education;Manual techniques;Passive range of motion;Moist Heat;Dry needling;Energy conservation;Taping    PT Next Visit Plan  cotninue core and lumbar strengthening exercises as well as continue stretching hip flexors and piriformis muscle,  functional lower extremity strengthening, balance training, and continue manual STM    PT Home Exercise Plan  02/26/18: Piriformis stretch 3 x 30 seconds each leg 1x/day, Hip flexor stretch 3 x 30 seconds each leg 1x/day, Posterior pelvic tilt x 15 5 second holds 1x/day, TA activation 10 x 10 second holds and TA activation with marching x 20 with 3'' holds 1x/day     Consulted and Agree with Plan of Care  Patient       Patient will benefit from skilled therapeutic intervention in order to improve the following deficits and impairments:  Decreased balance, Decreased mobility,  Improper body mechanics, Decreased activity tolerance, Decreased strength, Increased fascial restricitons, Impaired flexibility, Postural dysfunction, Pain  Visit Diagnosis: Chronic bilateral low back pain without sciatica  Muscle weakness (generalized)  Other symptoms and signs involving the musculoskeletal system     Problem List Patient Active Problem List   Diagnosis Date Noted  . History of colonic polyps   . Hx of adenomatous colonic polyps 01/06/2015  . Prostate cancer ([place]) 10/01/2014  8:51 AM, 03/08/18 Etta Grandchild, PT, DPT Physical Therapist - [place] [phone] (Office)    Etta Grandchild 03/08/2018, 8:51 AM  [place] [address] [place], [place], [address] Phone: [phone]   Fax:  [phone]  Name: Erik Murray MRN: [id number] Date of Birth: 23-Apr-1946

## 2018-03-11 ENCOUNTER — Encounter (HOSPITAL_COMMUNITY): Payer: Self-pay | Admitting: Physical Therapy

## 2018-03-11 ENCOUNTER — Other Ambulatory Visit: Payer: Self-pay

## 2018-03-11 ENCOUNTER — Ambulatory Visit (HOSPITAL_COMMUNITY): Payer: Medicare Other | Admitting: Physical Therapy

## 2018-03-11 DIAGNOSIS — M545 Low back pain, unspecified: Secondary | ICD-10-CM

## 2018-03-11 DIAGNOSIS — G8929 Other chronic pain: Secondary | ICD-10-CM

## 2018-03-11 DIAGNOSIS — R29898 Other symptoms and signs involving the musculoskeletal system: Secondary | ICD-10-CM

## 2018-03-11 DIAGNOSIS — M6281 Muscle weakness (generalized): Secondary | ICD-10-CM

## 2018-03-11 NOTE — Therapy (Signed)
[place], [place], [address] Phone: [phone]   Fax:  [phone]  Physical Therapy Treatment  Patient Details  Name: Erik Murray MRN: [id number] Date of Birth: Jun 19, 1946 Referring Provider: Asencion Noble MD   Encounter Date: 03/11/2018  PT End of Session - 03/11/18 0901    Visit Number  5    Number of Visits  13    Date for PT Re-Evaluation  04/09/18    Authorization Type  [employer] Medicare (based on medical necessity); reassessment on 03/19/18, reevaluation on 04/09/18    Authorization Time Period  02/26/18 - 04/09/18    Authorization - Visit Number  5    Authorization - Number of Visits  10    PT Start Time  5732    PT Stop Time  2025    PT Time Calculation (min)  42 min    Activity Tolerance  Patient tolerated treatment well;No increased pain    Behavior During Therapy  WFL for tasks assessed/performed       Past Medical History:  Diagnosis Date  . Arthritis   . Cancer [facility])    prostate  . Hx of adenomatous colonic polyps     Past Surgical History:  Procedure Laterality Date  . COLONOSCOPY  02/05/2012   [facility]:CWCBJSE and rectal polyps/few diverticulosis (adenomas). next TCS 01/2015  . COLONOSCOPY N/A 02/10/2015   Procedure: COLONOSCOPY;  Surgeon: Daneil Dolin, MD;  [facility];  Service: Endoscopy;  Laterality: N/A;  . EYE SURGERY Bilateral apirl, may 2013   cataract lens replacement  . LYMPHADENECTOMY Bilateral 10/01/2014   Procedure: LYMPHADENECTOMY;  Surgeon: Raynelle Bring, MD;  [facility];  Service: Urology;  Laterality: Bilateral;  . PROSTATE BIOPSY  dec 2015  . ROBOT ASSISTED LAPAROSCOPIC RADICAL PROSTATECTOMY N/A 10/01/2014   Procedure: ROBOTIC ASSISTED LAPAROSCOPIC RADICAL PROSTATECTOMY LEVEL 2;  Surgeon: Raynelle Bring, MD;  [facility];  Service: Urology;  Laterality: N/A;  . TONSILLECTOMY  as child  . TRIGGER FINGER RELEASE  10/10/2011   Procedure: RELEASE TRIGGER  FINGER/A-1 PULLEY;  Surgeon: Wynonia Sours, MD;  Location: [place];  Service: Orthopedics;  Laterality: Right;  right thumb  . TRIGGER FINGER RELEASE Right 05/23/2016   Procedure: RELEASE TRIGGER FINGER/A-1 PULLEY right index finger;  Surgeon: Daryll Brod, MD;  Location: [place];  Service: Orthopedics;  Laterality: Right;  FAB  . TRIGGER FINGER RELEASE Left 07/18/2016   Procedure: RELEASE TRIGGER FINGER/A-1 PULLEY, left index;  Surgeon: Daryll Brod, MD;  Location: [place];  Service: Orthopedics;  Laterality: Left;  FAB  . YAG LASER APPLICATION Right 04/14/5175   Procedure: YAG LASER APPLICATION;  Surgeon: Rutherford Guys, MD;  [facility];  Service: Ophthalmology;  Laterality: Right;  . YAG LASER APPLICATION Left 1/60/7371   Procedure: YAG LASER APPLICATION;  Surgeon: Rutherford Guys, MD;  [facility];  Service: Ophthalmology;  Laterality: Left;    There were no vitals filed for this visit.  Subjective Assessment - 03/11/18 0821    Subjective  PT states that his back pain is migrating up his back.  He is completing his exercises at home and walking 2-3 miles a day.      Pertinent History  Prostatectomy 09/2014    Limitations  Sitting;Standing;Lifting    How long can you sit comfortably?  10-15 minutes    How long can you stand comfortably?  5-10 minutes    How long  can you walk comfortably?  Not limited     Diagnostic tests  Lumbar spine x-ray Disc Space narrowing at L4-5 and L5-S1 10/02/17    Patient Stated Goals  Get away from the constant pain    Pain Score  2     Pain Location  Back    Pain Orientation  Right    Pain Descriptors / Indicators  Discomfort    Pain Type  Chronic pain    Pain Onset  More than a month ago    Aggravating Factors   getting out of a car catches                        [facility] Adult PT Treatment/Exercise - 03/11/18 0001      Balance Poses: Yoga   Tree Pose  3 reps;30 seconds      Exercises    Exercises  Lumbar      Lumbar Exercises: Stretches   Other Lumbar Stretch Exercise  3 D lumbar and thoracic exericses x 3       Lumbar Exercises: Standing   Other Standing Lumbar Exercises  Y lift off x 10       Lumbar Exercises: Prone   Opposite Arm/Leg Raise  Right arm/Left leg;Left arm/Right leg;10 reps    Other Prone Lumbar Exercises  I ,Y, and T's x 10     Other Prone Lumbar Exercises  Rows; shoulder extension  x 10       Manual Therapy   Manual Therapy  Joint mobilization    Manual therapy comments  completed separate rest of treatment    Joint Mobilization  thoracic and upper lumbar area              PT Education - 03/11/18 0900    Education Details  Given lumbar and thoracic excursion exercises     Person(s) Educated  Patient    Methods  Explanation;Demonstration;Verbal cues;Handout    Comprehension  Verbalized understanding;Returned demonstration       PT Short Term Goals - 03/04/18 0856      PT SHORT TERM GOAL #1   Title  Patient will report understanding and regular compliance with HEP in order to return to PLOF.     Time  3    Period  Weeks    Status  On-going      PT SHORT TERM GOAL #2   Title  Patient will demonstrate improvement of 1/2 MMT grade strength in all deficient muscle groups to help patient perform functional activities without pain.     Time  3    Period  Weeks    Status  On-going      PT SHORT TERM GOAL #3   Title  Patient will report that his pain has not exceeded a 5/10 over the course of a 1 week period indicating improved tolerance to functional activities.     Time  3    Period  Weeks    Status  On-going        PT Long Term Goals - 03/04/18 0857      PT LONG TERM GOAL #1   Title  Patient will demonstrate improvement of 1 MMT grade strength in all deficient muscle groups to help patient perform functional activities without pain.     Time  6    Period  Weeks    Status  On-going      PT LONG TERM GOAL #2   Title  Patient will demonstrate ability to maintain single limb stance on each lower extremity for at least 30 seconds without compensations indicating improved stability and balance.     Time  6    Period  Weeks    Status  On-going      PT LONG TERM GOAL #3   Title  Patient will report that his pain has not exceeded a 2/10 over the course of a 1 week period indicating improved tolerance to functional activities.     Time  6    Period  Weeks    Status  On-going      PT LONG TERM GOAL #4   Title  Patient will deny tenderness to palpation of lumbar paraspinals with minimal to know muscular restrictions felt throughout.     Time  6    Period  Weeks    Status  On-going            Plan - 03/11/18 0901    Clinical Impression Statement  Pt pain is now in upper lumbar/lower thoracic area on Rt side.  Added both lumbar and thoracic excursion exercises to improve mobility as well as lower thoracic/upper lumbar mobs to improve mobilization.  Tight paraspinal mm noted.     Rehab Potential  Good    Clinical Impairments Affecting Rehab Potential  Positive: Active, motivated. Negative: Chronicity of issue    PT Frequency  2x / week    PT Duration  6 weeks    PT Treatment/Interventions  ADLs/Self Care Home Management;Aquatic Therapy;Cryotherapy;Gait training;Stair training;Functional mobility training;Therapeutic activities;Therapeutic exercise;Balance training;Neuromuscular re-education;Patient/family education;Manual techniques;Passive range of motion;Moist Heat;Dry needling;Energy conservation;Taping    PT Next Visit Plan  assess how mobs affected pt pain.     PT Home Exercise Plan  02/26/18: Piriformis stretch 3 x 30 seconds each leg 1x/day, Hip flexor stretch 3 x 30 seconds each leg 1x/day, Posterior pelvic tilt x 15 5 second holds 1x/day, TA activation 10 x 10 second holds and TA activation with marching x 20 with 3'' holds 1x/day ; 7/29:  thoracic and lumbar excursions.     Consulted and Agree with  Plan of Care  Patient       Patient will benefit from skilled therapeutic intervention in order to improve the following deficits and impairments:  Decreased balance, Decreased mobility, Improper body mechanics, Decreased activity tolerance, Decreased strength, Increased fascial restricitons, Impaired flexibility, Postural dysfunction, Pain  Visit Diagnosis: Chronic bilateral low back pain without sciatica  Muscle weakness (generalized)  Other symptoms and signs involving the musculoskeletal system     Problem List Patient Active Problem List   Diagnosis Date Noted  . History of colonic polyps   . Hx of adenomatous colonic polyps 01/06/2015  . Prostate cancer [facility]) 10/01/2014    Rayetta Humphrey, PT CLT [phone] 03/11/2018, 9:05 AM  [place] [address] [place], [place], [address] Phone: [phone]   Fax:  [phone]  Name: Erik Murray MRN: [id number] Date of Birth: 12-14-1945

## 2018-03-15 ENCOUNTER — Encounter (HOSPITAL_COMMUNITY): Payer: Self-pay | Admitting: Physical Therapy

## 2018-03-15 ENCOUNTER — Ambulatory Visit (HOSPITAL_COMMUNITY): Payer: Medicare Other | Attending: Internal Medicine | Admitting: Physical Therapy

## 2018-03-15 DIAGNOSIS — M545 Low back pain, unspecified: Secondary | ICD-10-CM

## 2018-03-15 DIAGNOSIS — G8929 Other chronic pain: Secondary | ICD-10-CM | POA: Insufficient documentation

## 2018-03-15 DIAGNOSIS — M6281 Muscle weakness (generalized): Secondary | ICD-10-CM | POA: Insufficient documentation

## 2018-03-15 DIAGNOSIS — R29898 Other symptoms and signs involving the musculoskeletal system: Secondary | ICD-10-CM | POA: Diagnosis present

## 2018-03-15 NOTE — Patient Instructions (Signed)
  SINGLE KNEE TO CHEST STRETCH - [place] While Lying on your back, hold your knee and gently pull it up towards your chest. Each leg Repeat 2 Times Hold 30 Seconds Complete 1 Set Perform 1 Time(s) a Day   DOUBLE KNEE TO CHEST STRETCH - [facility] While Lying on your back, hold your knees and gently pull them up towards your chest. Repeat 2 Times Hold 30 Seconds Complete 1 Set Perform 1 Time(s) a Day

## 2018-03-15 NOTE — Therapy (Addendum)
[place], [place], [address] Phone: [phone]   Fax:  [phone]  Physical Therapy Treatment  Patient Details  Name: Erik Murray MRN: [id number] Date of Birth: [birth year] Referring Provider: Asencion Noble MD   Encounter Date: 03/15/2018  PT End of Session - 03/15/18 1012    Visit Number  6   Number of Visits  13    Date for PT Re-Evaluation  04/09/18    Authorization Type  [employer] Medicare (based on medical necessity); reassessment on 03/19/18, reevaluation on 04/09/18    Authorization Time Period  02/26/18 - 04/09/18    Authorization - Visit Number  6   Authorization - Number of Visits  10    PT Start Time  0948    PT Stop Time  1026    PT Time Calculation (min)  38 min    Activity Tolerance  Patient tolerated treatment well;No increased pain    Behavior During Therapy  WFL for tasks assessed/performed       Past Medical History:  Diagnosis Date  . Arthritis   . Cancer [facility])    prostate  . Hx of adenomatous colonic polyps     Past Surgical History:  Procedure Laterality Date  . COLONOSCOPY  02/05/2012   JJO:ACZYSAY and rectal polyps/few diverticulosis (adenomas). next TCS 01/2015  . COLONOSCOPY N/A 02/10/2015   Procedure: COLONOSCOPY;  Surgeon: Daneil Dolin, MD;  [facility];  Service: Endoscopy;  Laterality: N/A;  . EYE SURGERY Bilateral apirl, may 2013   cataract lens replacement  . LYMPHADENECTOMY Bilateral 10/01/2014   Procedure: LYMPHADENECTOMY;  Surgeon: Raynelle Bring, MD;  [facility];  Service: Urology;  Laterality: Bilateral;  . PROSTATE BIOPSY  dec 2015  . ROBOT ASSISTED LAPAROSCOPIC RADICAL PROSTATECTOMY N/A 10/01/2014   Procedure: ROBOTIC ASSISTED LAPAROSCOPIC RADICAL PROSTATECTOMY LEVEL 2;  Surgeon: Raynelle Bring, MD;  [facility];  Service: Urology;  Laterality: N/A;  . TONSILLECTOMY  as child  . TRIGGER FINGER RELEASE  10/10/2011   Procedure: RELEASE TRIGGER  FINGER/A-1 PULLEY;  Surgeon: Wynonia Sours, MD;  Location: [place];  Service: Orthopedics;  Laterality: Right;  right thumb  . TRIGGER FINGER RELEASE Right 05/23/2016   Procedure: RELEASE TRIGGER FINGER/A-1 PULLEY right index finger;  Surgeon: Daryll Brod, MD;  Location: [place];  Service: Orthopedics;  Laterality: Right;  FAB  . TRIGGER FINGER RELEASE Left 07/18/2016   Procedure: RELEASE TRIGGER FINGER/A-1 PULLEY, left index;  Surgeon: Daryll Brod, MD;  Location: [place];  Service: Orthopedics;  Laterality: Left;  FAB  . YAG LASER APPLICATION Right 10/12/6008   Procedure: YAG LASER APPLICATION;  Surgeon: Rutherford Guys, MD;  [facility];  Service: Ophthalmology;  Laterality: Right;  . YAG LASER APPLICATION Left 9/32/3557   Procedure: YAG LASER APPLICATION;  Surgeon: Rutherford Guys, MD;  [facility];  Service: Ophthalmology;  Laterality: Left;    There were no vitals filed for this visit.  Subjective Assessment - 03/15/18 0949    Subjective  Patient reported that he is not having any pain today.     Pertinent History  Prostatectomy 09/2014    Limitations  Sitting;Standing;Lifting    How long can you sit comfortably?  10-15 minutes    How long can you stand comfortably?  5-10 minutes    How long can you walk comfortably?  Not limited     Diagnostic tests  Lumbar spine x-ray Disc  Space narrowing at L4-5 and L5-S1 10/02/17    Patient Stated Goals  Get away from the constant pain    Currently in Pain?  No/denies    Pain Onset  --                       [facility] Adult PT Treatment/Exercise - 03/15/18 0001      Balance Poses: Yoga   Tree Pose  3 reps;30 seconds      Lumbar Exercises: Stretches   Single Knee to Chest Stretch  Right;Left;2 reps;30 seconds    Double Knee to Chest Stretch  2 reps;30 seconds    Other Lumbar Stretch Exercise  3 D lumbar and thoracic exericses x 3       Lumbar Exercises: Standing   Functional  Squats  15 reps;Limitations    Functional Squats Limitations  2x15 hand hold on parallel bars with verbal cues for form    Forward Lunge  15 reps;Limitations    Forward Lunge Limitations  Onto 4 inch step with cues to maintain tight abdominals     Other Standing Lumbar Exercises  Y lift off x 10     Other Standing Lumbar Exercises  Tandem stance on foam x15 each leg forward overhead shoulder flexion with 2# bar weight. Vector stance forward side and back 5'' holds x 10 each lower extremity.      Lumbar Exercises: Seated   Sit to Stand  10 reps    Other Seated Lumbar Exercises  With single upper extremity supported Seated marching on physioball with abdominal activation x 20 alternating legs      Lumbar Exercises: Supine   Other Supine Lumbar Exercises  Trunk rotation bilateral knee fall outs x 10                PT Short Term Goals - 03/04/18 0856      PT SHORT TERM GOAL #1   Title  Patient will report understanding and regular compliance with HEP in order to return to PLOF.     Time  3    Period  Weeks    Status  On-going      PT SHORT TERM GOAL #2   Title  Patient will demonstrate improvement of 1/2 MMT grade strength in all deficient muscle groups to help patient perform functional activities without pain.     Time  3    Period  Weeks    Status  On-going      PT SHORT TERM GOAL #3   Title  Patient will report that his pain has not exceeded a 5/10 over the course of a 1 week period indicating improved tolerance to functional activities.     Time  3    Period  Weeks    Status  On-going        PT Long Term Goals - 03/04/18 0857      PT LONG TERM GOAL #1   Title  Patient will demonstrate improvement of 1 MMT grade strength in all deficient muscle groups to help patient perform functional activities without pain.     Time  6    Period  Weeks    Status  On-going      PT LONG TERM GOAL #2   Title  Patient will demonstrate ability to maintain single limb stance on  each lower extremity for at least 30 seconds without compensations indicating improved stability and balance.     Time  6  Period  Weeks    Status  On-going      PT LONG TERM GOAL #3   Title  Patient will report that his pain has not exceeded a 2/10 over the course of a 1 week period indicating improved tolerance to functional activities.     Time  6    Period  Weeks    Status  On-going      PT LONG TERM GOAL #4   Title  Patient will deny tenderness to palpation of lumbar paraspinals with minimal to know muscular restrictions felt throughout.     Time  6    Period  Weeks    Status  On-going            Plan - 03/15/18 1031    Clinical Impression Statement  This session continued to progress patient with exercises to improve lumbar stabilization and mobility,  balance, and functional lower extremity strengthening. This session added tandem stance on foam with overhead flexion and seated on physioball marching with abdominal activation. Patient reported challenge with seated on physioball marching and required single upper extremity support in order to maintain balance with this exercise. Patient's HEP was upadated and discussed with patient continuing exercises as he will be out of town for work the next couple weeks and will not be able to attend therapy.     Rehab Potential  Good    Clinical Impairments Affecting Rehab Potential  Positive: Active, motivated. Negative: Chronicity of issue    PT Frequency  2x / week    PT Duration  6 weeks    PT Treatment/Interventions  ADLs/Self Care Home Management;Aquatic Therapy;Cryotherapy;Gait training;Stair training;Functional mobility training;Therapeutic activities;Therapeutic exercise;Balance training;Neuromuscular re-education;Patient/family education;Manual techniques;Passive range of motion;Moist Heat;Dry needling;Energy conservation;Taping    PT Next Visit Plan  Re-assess at next session; follow up on HEP     PT Home Exercise Plan   02/26/18: Piriformis stretch 3 x 30 seconds each leg 1x/day, Hip flexor stretch 3 x 30 seconds each leg 1x/day, Posterior pelvic tilt x 15 5 second holds 1x/day, TA activation 10 x 10 second holds and TA activation with marching x 20 with 3'' holds 1x/day ; 7/29:  thoracic and lumbar excursions. 03/15/18: Single knee to chest and double knee to chest 2x30 each 1x/day    Consulted and Agree with Plan of Care  Patient       Patient will benefit from skilled therapeutic intervention in order to improve the following deficits and impairments:  Decreased balance, Decreased mobility, Improper body mechanics, Decreased activity tolerance, Decreased strength, Increased fascial restricitons, Impaired flexibility, Postural dysfunction, Pain  Visit Diagnosis: Chronic bilateral low back pain without sciatica  Muscle weakness (generalized)  Other symptoms and signs involving the musculoskeletal system     Problem List Patient Active Problem List   Diagnosis Date Noted  . History of colonic polyps   . Hx of adenomatous colonic polyps 01/06/2015  . Prostate cancer ([place]) 10/01/2014   Clarene Critchley PT, DPT 10:38 AM, 03/15/18 [place], [place], [address] Phone: [phone]   Fax:  [phone]  Name: Erik Murray MRN: [id number] Date of Birth: [birth year]

## 2018-03-19 ENCOUNTER — Encounter (HOSPITAL_COMMUNITY): Payer: Medicare Other

## 2018-03-20 ENCOUNTER — Encounter (HOSPITAL_COMMUNITY): Payer: Medicare Other

## 2018-03-25 ENCOUNTER — Encounter (HOSPITAL_COMMUNITY): Payer: Medicare Other

## 2018-03-26 ENCOUNTER — Encounter (HOSPITAL_COMMUNITY): Payer: Medicare Other

## 2018-03-27 ENCOUNTER — Telehealth (HOSPITAL_COMMUNITY): Payer: Self-pay | Admitting: Internal Medicine

## 2018-03-27 NOTE — Telephone Encounter (Signed)
03/27/18  pt came in the office to change some appts... [place]

## 2018-03-29 ENCOUNTER — Encounter (HOSPITAL_COMMUNITY): Payer: Medicare Other

## 2018-04-01 ENCOUNTER — Encounter (HOSPITAL_COMMUNITY): Payer: Medicare Other

## 2018-04-03 ENCOUNTER — Encounter (HOSPITAL_COMMUNITY): Payer: Self-pay | Admitting: Physical Therapy

## 2018-04-03 ENCOUNTER — Ambulatory Visit (HOSPITAL_COMMUNITY): Payer: Medicare Other | Admitting: Physical Therapy

## 2018-04-03 ENCOUNTER — Ambulatory Visit (INDEPENDENT_AMBULATORY_CARE_PROVIDER_SITE_OTHER): Payer: Self-pay

## 2018-04-03 DIAGNOSIS — M6281 Muscle weakness (generalized): Secondary | ICD-10-CM

## 2018-04-03 DIAGNOSIS — M545 Low back pain, unspecified: Secondary | ICD-10-CM

## 2018-04-03 DIAGNOSIS — R29898 Other symptoms and signs involving the musculoskeletal system: Secondary | ICD-10-CM

## 2018-04-03 DIAGNOSIS — G8929 Other chronic pain: Secondary | ICD-10-CM

## 2018-04-03 DIAGNOSIS — Z86010 Personal history of colonic polyps: Secondary | ICD-10-CM

## 2018-04-03 MED ORDER — NA SULFATE-K SULFATE-MG SULF 17.5-3.13-1.6 GM/177ML PO SOLN: 1.0000 | ORAL | 0 refills | Status: DC

## 2018-04-03 NOTE — Therapy (Signed)
[place] [address] [place], [place], [address] Phone: [phone]   Fax:  [phone]  Physical Therapy Treatment / Progress Note / Discharge Summary  Patient Details  Name: Erik Murray MRN: [id number] Date of Birth: [birth year] Referring Provider: Asencion Noble MD   Encounter Date: 04/03/2018   Progress Note Reporting Period 02/26/18 to 04/03/18  See note below for Objective Data and Assessment of Progress/Goals.       PT End of Session - 04/03/18 9371    Visit Number  7    Number of Visits  13    Date for PT Re-Evaluation  04/09/18    Authorization Type  [employer] Medicare (based on medical necessity); reassessment on 03/19/18, reevaluation on 04/09/18    Authorization Time Period  02/26/18 - 04/09/18    Authorization - Visit Number  7    Authorization - Number of Visits  10    PT Start Time  0820    PT Stop Time  6967    PT Time Calculation (min)  23 min    Activity Tolerance  Patient tolerated treatment well;No increased pain    Behavior During Therapy  WFL for tasks assessed/performed       Past Medical History:  Diagnosis Date  . Arthritis   . Cancer [facility])    prostate  . Hx of adenomatous colonic polyps     Past Surgical History:  Procedure Laterality Date  . COLONOSCOPY  02/05/2012   ELF:YBOFBPZ and rectal polyps/few diverticulosis (adenomas). next TCS 01/2015  . COLONOSCOPY N/A 02/10/2015   Procedure: COLONOSCOPY;  Surgeon: Daneil Dolin, MD;  [facility];  Service: Endoscopy;  Laterality: N/A;  . EYE SURGERY Bilateral apirl, may 2013   cataract lens replacement  . LYMPHADENECTOMY Bilateral 10/01/2014   Procedure: LYMPHADENECTOMY;  Surgeon: Raynelle Bring, MD;  [facility];  Service: Urology;  Laterality: Bilateral;  . PROSTATE BIOPSY  dec 2015  . ROBOT ASSISTED LAPAROSCOPIC RADICAL PROSTATECTOMY N/A 10/01/2014   Procedure: ROBOTIC ASSISTED LAPAROSCOPIC RADICAL PROSTATECTOMY LEVEL 2;  Surgeon:  Raynelle Bring, MD;  [facility];  Service: Urology;  Laterality: N/A;  . TONSILLECTOMY  as child  . TRIGGER FINGER RELEASE  10/10/2011   Procedure: RELEASE TRIGGER FINGER/A-1 PULLEY;  Surgeon: Wynonia Sours, MD;  Location: [place];  Service: Orthopedics;  Laterality: Right;  right thumb  . TRIGGER FINGER RELEASE Right 05/23/2016   Procedure: RELEASE TRIGGER FINGER/A-1 PULLEY right index finger;  Surgeon: Daryll Brod, MD;  Location: [place];  Service: Orthopedics;  Laterality: Right;  FAB  . TRIGGER FINGER RELEASE Left 07/18/2016   Procedure: RELEASE TRIGGER FINGER/A-1 PULLEY, left index;  Surgeon: Daryll Brod, MD;  Location: [place];  Service: Orthopedics;  Laterality: Left;  FAB  . YAG LASER APPLICATION Right 0/25/8527   Procedure: YAG LASER APPLICATION;  Surgeon: Rutherford Guys, MD;  [facility];  Service: Ophthalmology;  Laterality: Right;  . YAG LASER APPLICATION Left 7/82/4235   Procedure: YAG LASER APPLICATION;  Surgeon: Rutherford Guys, MD;  [facility];  Service: Ophthalmology;  Laterality: Left;    There were no vitals filed for this visit.  Subjective Assessment - 04/03/18 0849    Subjective  Patient reported that he has been doing well. He stated he has been doing his exercises daily. He reported that the worst his back pain has been over the last week is a 2/10. He stated he feels like  he could continue the exercises on his own.     Pertinent History  Prostatectomy 09/2014    Limitations  Sitting;Standing;Lifting    Diagnostic tests  Lumbar spine x-ray Disc Space narrowing at L4-5 and L5-S1 10/02/17    Patient Stated Goals  Get away from the constant pain    Currently in Pain?  No/denies         [facility] PT Assessment - 04/03/18 0001      Assessment   Medical Diagnosis  Lumbar Disc Disease - Back Pain    Referring Provider  Asencion Noble MD    Prior Therapy  Yes, for achilles tendon      Prior Function   Level of  Independence  Independent;Independent with basic ADLs    Vocation  Retired;Part time employment    [employer] a lot of sitting      Cognition   Overall Cognitive Status  Within Functional Limits for tasks assessed      Observation/Other Assessments   Observations  Increased lumbar lordosis in standing    Focus on Therapeutic Outcomes (FOTO)   77% (23% limited)      AROM   Lumbar Flexion  25% limited   No pain with any ROM   Lumbar Extension  25% limited    Lumbar - Right Side Bend  25% limited     Lumbar - Left Side Bend  Not limited     Lumbar - Right Rotation  Not limited    Lumbar - Left Rotation  Not limited      Strength   Overall Strength Comments  5/5 lumbar flexion    Right Hip Flexion  5/5    Right Hip Extension  5/5    Right Hip ABduction  5/5    Left Hip Flexion  5/5    Left Hip Extension  5/5    Left Hip ABduction  5/5    Right Knee Flexion  5/5    Right Knee Extension  5/5    Left Knee Flexion  5/5    Left Knee Extension  5/5    Right Ankle Dorsiflexion  5/5    Right Ankle Plantar Flexion  5/5    Left Ankle Dorsiflexion  5/5    Left Ankle Plantar Flexion  5/5      Flexibility   Soft Tissue Assessment /Muscle Length  yes    Hamstrings  Minimally limited    Quadriceps  Minimally limited bilaterally      Palpation   Palpation comment  Patient denied any tenderness to palpation and with only minimal muscular restrictions of lumbar paraspinals      Balance   Balance Assessed  Yes      Static Standing Balance   Static Standing - Balance Support  No upper extremity supported    Static Standing Balance -  Activities   Single Leg Stance - Right Leg;Single Leg Stance - Left Leg    Static Standing - Comment/# of Minutes  Firm surface SLS Lt: 36.90 seconds. SLS Rt: more than 1 minute                           PT Education - 04/03/18 0850    Education Details  Educated patient on findings or re-assessment and updated HEP.      Person(s) Educated  Patient    Methods  Explanation    Comprehension  Verbalized understanding  PT Short Term Goals - 04/03/18 4782      PT SHORT TERM GOAL #1   Title  Patient will report understanding and regular compliance with HEP in order to return to PLOF.     Baseline  04/03/18: Patient has demonstrated understanding and reports regular compliance.     Time  3    Period  Weeks    Status  Achieved      PT SHORT TERM GOAL #2   Title  Patient will demonstrate improvement of 1/2 MMT grade strength in all deficient muscle groups to help patient perform functional activities without pain.     Baseline  04/03/18: See MMT.     Time  3    Period  Weeks    Status  Achieved      PT SHORT TERM GOAL #3   Title  Patient will report that his pain has not exceeded a 5/10 over the course of a 1 week period indicating improved tolerance to functional activities.     Baseline  04/03/18: Patient reported maximum pain of 2/10 over a 1 week period.     Time  3    Period  Weeks    Status  Achieved        PT Long Term Goals - 04/03/18 0854      PT LONG TERM GOAL #1   Title  Patient will demonstrate improvement of 1 MMT grade strength in all deficient muscle groups to help patient perform functional activities without pain.     Baseline  04/03/18: Achieved. See MMT.     Time  6    Period  Weeks    Status  Achieved      PT LONG TERM GOAL #2   Title  Patient will demonstrate ability to maintain single limb stance on each lower extremity for at least 30 seconds without compensations indicating improved stability and balance.     Baseline  04/03/18: Achieved, see objective measures.     Time  6    Period  Weeks    Status  On-going      PT LONG TERM GOAL #3   Title  Patient will report that his pain has not exceeded a 2/10 over the course of a 1 week period indicating improved tolerance to functional activities.     Baseline  04/03/18: Patient reported he feels he has met this goal.      Time  6    Period  Weeks    Status  Achieved      PT LONG TERM GOAL #4   Title  Patient will deny tenderness to palpation of lumbar paraspinals with minimal to know muscular restrictions felt throughout.     Baseline  03/15/18: Patient denied tenderness to palpation of lumbar paraspinals and noted minimal muscular restrictions with palpation.     Time  6    Period  Weeks    Status  Achieved            Plan - 04/03/18 0900    Clinical Impression Statement  Patient returned to therapy after not able to attend due to work for at least a week. Re-assessment performed with patient meeting all short term and long term goals. Patient has demonstrated improvements in strength, mobility, balance/stability, and reported decreased levels of pain. Patient reported that he feels he has made good progress and is ready to be discharged from therapy to continue exercises on his own. The remainder of session, therapist discussed  home exercises, importance of continuing them and additional HEP to perform at home. Patient provided consent to discharge and reported understanding. Therapist told patient to call clinic with any future questions or concerns.     Rehab Potential  Good    Clinical Impairments Affecting Rehab Potential  Positive: Active, motivated. Negative: Chronicity of issue    PT Frequency  2x / week    PT Duration  6 weeks    PT Treatment/Interventions  ADLs/Self Care Home Management;Aquatic Therapy;Cryotherapy;Gait training;Stair training;Functional mobility training;Therapeutic activities;Therapeutic exercise;Balance training;Neuromuscular re-education;Patient/family education;Manual techniques;Passive range of motion;Moist Heat;Dry needling;Energy conservation;Taping    PT Next Visit Plan  Discharged    PT Home Exercise Plan  02/26/18: Piriformis stretch 3 x 30 seconds each leg 1x/day, Hip flexor stretch 3 x 30 seconds each leg 1x/day, Posterior pelvic tilt x 15 5 second holds 1x/day, TA  activation 10 x 10 second holds and TA activation with marching x 20 with 3'' holds 1x/day ; 7/29:  thoracic and lumbar excursions. 03/15/18: Single knee to chest and double knee to chest 2x30 each 1x/day; 04/03/18: SLS vectors forward side back 5'' holds x 5 each leg 1x/day, functional squats x 15 1x/day    Consulted and Agree with Plan of Care  Patient       Patient will benefit from skilled therapeutic intervention in order to improve the following deficits and impairments:  Decreased balance, Decreased mobility, Improper body mechanics, Decreased activity tolerance, Decreased strength, Increased fascial restricitons, Impaired flexibility, Postural dysfunction, Pain  Visit Diagnosis: Chronic bilateral low back pain without sciatica  Muscle weakness (generalized)  Other symptoms and signs involving the musculoskeletal system     Problem List Patient Active Problem List   Diagnosis Date Noted  . History of colonic polyps   . Hx of adenomatous colonic polyps 01/06/2015  . Prostate cancer ([place]) 10/01/2014   PHYSICAL THERAPY DISCHARGE SUMMARY  Visits from Start of Care: 7  Current functional level related to goals / functional outcomes: See above   Remaining deficits: See above   Education / Equipment: See above Plan: Patient agrees to discharge.  Patient goals were met. Patient is being discharged due to meeting the stated rehab goals.  ?????         Clarene Critchley PT, DPT 9:04 AM, 04/03/18 [place], [place], [address] Phone: [phone]   Fax:  [phone]  Name: NOVAK STGERMAINE MRN: [id number] Date of Birth: Jan 06, 1946

## 2018-04-03 NOTE — Patient Instructions (Signed)
  SINGLE LEG STANCE - FORWARD SLS Stand on one leg and maintain your balance. Next, hold your leg out in front of your body. Then return to original position. Maintain a slightly bent knee on the stance Side. Repeat 5 Times Hold 5 Seconds Complete 1 Set Perform 1 Time(s) a Day   SINGLE LEG STANCE - LATERAL SLS Stand on one leg and maintain your balance. Next, hold your leg out to the side of your body. Then return to original position. Maintain a slightly bent knee on the stance Side. Repeat 5 Times Hold 5 Seconds Complete 1 Set Perform 1 Time(s) a Day  SINGLE LEG STANCE - RETRO SLS Stand on one leg and maintain your balance. Next, hold your leg out behind your body. Then return to original position. Maintain a slightly bent knee on the stance side. Repeat 5 Times Hold 5 Seconds Complete 1 Set Perform 1 Time(s) a Day   SQUATS - SUPPORTED While standing with feet shoulder width apart and in front of a stable support for balance assist if needed, bend your knees and lower your body towards the floor. Your body weight should mostly be directed through the heels of your feet. Return to a standing position. Knees should bend in line with the 2nd toe and not pass the front of the foot. Repeat 15 Times Hold 1 Second Complete 1 Set Perform 1 Time(s) a Day

## 2018-04-03 NOTE — Patient Instructions (Signed)
Erik Murray  1946/04/13 MRN: [id number]     Procedure Date: 05/15/18 Time to register: 6:30am Place to register: [facility] Procedure Time: 7:30am Scheduled provider: R. Garfield Cornea, MD    PREPARATION FOR COLONOSCOPY WITH SUPREP BOWEL PREP KIT  Note: Suprep Bowel Prep Kit is a split-dose (2day) regimen. Consumption of BOTH 6-ounce bottles is required for a complete prep.  Please notify us immediately if you are diabetic, take iron supplements, or if you are on Coumadin or any other blood thinners.                                                                                                                                                    2 DAYS BEFORE PROCEDURE:  DATE: 05/13/18   DAY: Monday Begin clear liquid diet AFTER your lunch meal. NO SOLID FOODS after this point.  1 DAY BEFORE PROCEDURE:  DATE: 05/14/18  DAY: Tuesday Continue clear liquids the entire day - NO SOLID FOOD.    At 6:00pm: Complete steps 1 through 4 below, using ONE (1) 6-ounce bottle, before going to bed. Step 1:  Pour ONE (1) 6-ounce bottle of SUPREP liquid into the mixing container.  Step 2:  Add cool drinking water to the 16 ounce line on the container and mix.  Note: Dilute the solution concentrate as directed prior to use. Step 3:  DRINK ALL the liquid in the container. Step 4:  You MUST drink an additional two (2) or more 16 ounce containers of water over the next one (1) hour.   Continue clear liquids.  DAY OF PROCEDURE:   DATE: 05/15/18   DAY: Wednesday If you take medications for your heart, blood pressure, or breathing, you may take these medications.    5 hours before your procedure at 2:30am: Step 1:  Pour ONE (1) 6-ounce bottle of SUPREP liquid into the mixing container.  Step 2:  Add cool drinking water to the 16 ounce line on the container and mix.  Note: Dilute the solution concentrate as directed prior to use. Step 3:  DRINK ALL the liquid in the container. Step  4:  You MUST drink an additional two (2) or more 16 ounce containers of water over the next one (1) hour. You MUST complete the final glass of water at least 3 hours before your colonoscopy.   Nothing by mouth past 4:30am  You may take your morning medications with sip of water unless we have instructed otherwise.    Please see below for Dietary Information.  CLEAR LIQUIDS INCLUDE:  Water Jello (NOT red in color)   Ice Popsicles (NOT red in color)   Tea (sugar ok, no milk/cream) Powdered fruit flavored drinks  Coffee (sugar ok, no milk/cream) Gatorade/ Lemonade/ Kool-Aid  (NOT red in color)   Juice: apple, white grape, white cranberry  Soft drinks  Clear bullion, consomme, broth (fat free beef/chicken/vegetable)  Carbonated beverages (any kind)  Strained chicken noodle soup Hard Candy   Remember: Clear liquids are liquids that will allow you to see your fingers on the other side of a clear glass. Be sure liquids are NOT red in color, and not cloudy, but CLEAR.  DO NOT EAT OR DRINK ANY OF THE FOLLOWING:  Dairy products of any kind   Cranberry juice Tomato juice / V8 juice   Grapefruit juice Orange juice     Red grape juice  Do not eat any solid foods, including such foods as: cereal, oatmeal, yogurt, fruits, vegetables, creamed soups, eggs, bread, crackers, pureed foods in a blender, etc.   HELPFUL HINTS FOR DRINKING PREP SOLUTION:   Make sure prep is extremely cold. Mix and refrigerate the the morning of the prep. You may also put in the freezer.   You may try mixing some Crystal Light or Country Time Lemonade if you prefer. Mix in small amounts; add more if necessary.  Try drinking through a straw  Rinse mouth with water or a mouthwash between glasses, to remove after-taste.  Try sipping on a cold beverage /ice/ popsicles between glasses of prep.  Place a piece of sugar-free hard candy in mouth between glasses.  If you become nauseated, try consuming smaller amounts, or  stretch out the time between glasses. Stop for 30-60 minutes, then slowly start back drinking.     OTHER INSTRUCTIONS  You will need a responsible adult at least 71 years of age to accompany you and drive you home. This person must remain in the waiting room during your procedure. The hospital will cancel your procedure if you do not have a responsible adult with you.   1. Wear loose fitting clothing that is easily removed. 2. Leave jewelry and other valuables at home.  3. Remove all body piercing jewelry and leave at home. 4. Total time from sign-in until discharge is approximately 2-3 hours. 5. You should go home directly after your procedure and rest. You can resume normal activities the day after your procedure. 6. The day of your procedure you should not:  Drive  Make legal decisions  Operate machinery  Drink alcohol  Return to work   You may call the office (Dept: [phone]) before 5:00pm, or page the doctor on call [phone]) after 5:00pm, for further instructions, if necessary.   Insurance Information YOU WILL NEED TO CHECK WITH YOUR INSURANCE COMPANY FOR THE BENEFITS OF COVERAGE YOU HAVE FOR THIS PROCEDURE.  UNFORTUNATELY, NOT ALL INSURANCE COMPANIES HAVE BENEFITS TO COVER ALL OR PART OF THESE TYPES OF PROCEDURES.  IT IS YOUR RESPONSIBILITY TO CHECK YOUR BENEFITS, HOWEVER, WE WILL BE GLAD TO ASSIST YOU WITH ANY CODES YOUR INSURANCE COMPANY MAY NEED.    PLEASE NOTE THAT MOST INSURANCE COMPANIES WILL NOT COVER A SCREENING COLONOSCOPY FOR PEOPLE UNDER THE AGE OF 50  IF YOU HAVE BCBS INSURANCE, YOU MAY HAVE BENEFITS FOR A SCREENING COLONOSCOPY BUT IF POLYPS ARE FOUND THE DIAGNOSIS WILL CHANGE AND THEN YOU MAY HAVE A DEDUCTIBLE THAT WILL NEED TO BE MET. SO PLEASE MAKE SURE YOU CHECK YOUR BENEFITS FOR A SCREENING COLONOSCOPY AS WELL AS A DIAGNOSTIC COLONOSCOPY.

## 2018-04-03 NOTE — Progress Notes (Signed)
Gastroenterology Pre-Procedure Review  Request Date:04/03/18 Requesting Physician: 3 year recall last tcs 02/10/15 tubular adenoma  PATIENT REVIEW QUESTIONS: The patient responded to the following health history questions as indicated:    1. Diabetes Melitis: no 2. Joint replacements in the past 12 months: no 3. Major health problems in the past 3 months: no 4. Has an artificial valve or MVP: no 5. Has a defibrillator: no 6. Has been advised in past to take antibiotics in advance of a procedure like teeth cleaning: no 7. Family history of colon cancer: no  8. Alcohol Use: yes (socially) 9. History of sleep apnea: no  10. History of coronary artery or other vascular stents placed within the last 12 months: no 11. History of any prior anesthesia complications: no    MEDICATIONS & ALLERGIES:    Patient reports the following regarding taking any blood thinners:   Plavix? no Aspirin? no Coumadin? no Brilinta? no Xarelto? no Eliquis? no Pradaxa? no Savaysa? no Effient? no  Patient confirms/reports the following medications:  Current Outpatient Medications  Medication Sig Dispense Refill  . Multiple Vitamins-Minerals (MULTIVITAMIN ADULT PO) Take 1 tablet by mouth daily.     No current facility-administered medications for this visit.     Patient confirms/reports the following allergies:  No Known Allergies  No orders of the defined types were placed in this encounter.   AUTHORIZATION INFORMATION Primary Insurance: [employer] medicare,  [place] #:[id number] Pre-Cert / Auth required: no   SCHEDULE INFORMATION: Procedure has been scheduled as follows:  Date: 05/15/18, Time: 7:30 Location: [facility]  This Gastroenterology Pre-Precedure Review Form is being routed to the following provider(s): Walden Field NP

## 2018-04-04 NOTE — Progress Notes (Signed)
Ok to schedule.

## 2018-04-05 ENCOUNTER — Encounter (HOSPITAL_COMMUNITY): Payer: Medicare Other

## 2018-04-08 ENCOUNTER — Encounter (HOSPITAL_COMMUNITY): Payer: Medicare Other

## 2018-04-10 ENCOUNTER — Encounter (HOSPITAL_COMMUNITY): Payer: Medicare Other

## 2018-04-16 ENCOUNTER — Encounter (HOSPITAL_COMMUNITY): Payer: Medicare Other | Admitting: Physical Therapy

## 2018-04-18 ENCOUNTER — Encounter (HOSPITAL_COMMUNITY): Payer: Medicare Other

## 2018-04-22 ENCOUNTER — Encounter (HOSPITAL_COMMUNITY): Payer: Medicare Other

## 2018-04-24 ENCOUNTER — Encounter (HOSPITAL_COMMUNITY): Payer: Medicare Other

## 2018-05-15 ENCOUNTER — Ambulatory Visit (HOSPITAL_COMMUNITY)
Admission: RE | Admit: 2018-05-15 | Discharge: 2018-05-15 | Disposition: A | Discharge status: Home / Self Care | Payer: Medicare Other | Source: Ambulatory Visit | Attending: Internal Medicine | Admitting: Internal Medicine

## 2018-05-15 ENCOUNTER — Encounter (HOSPITAL_COMMUNITY)
Admission: RE | Disposition: A | Discharge status: Home / Self Care | Payer: Self-pay | Source: Ambulatory Visit | Attending: Internal Medicine

## 2018-05-15 ENCOUNTER — Encounter (HOSPITAL_COMMUNITY): Payer: Self-pay | Admitting: *Deleted

## 2018-05-15 ENCOUNTER — Other Ambulatory Visit: Payer: Self-pay

## 2018-05-15 DIAGNOSIS — D127 Benign neoplasm of rectosigmoid junction: Secondary | ICD-10-CM | POA: Insufficient documentation

## 2018-05-15 DIAGNOSIS — K5730 Diverticulosis of large intestine without perforation or abscess without bleeding: Secondary | ICD-10-CM | POA: Diagnosis not present

## 2018-05-15 DIAGNOSIS — Z87891 Personal history of nicotine dependence: Secondary | ICD-10-CM | POA: Diagnosis not present

## 2018-05-15 DIAGNOSIS — Z1211 Encounter for screening for malignant neoplasm of colon: Secondary | ICD-10-CM | POA: Insufficient documentation

## 2018-05-15 DIAGNOSIS — M1990 Unspecified osteoarthritis, unspecified site: Secondary | ICD-10-CM | POA: Diagnosis not present

## 2018-05-15 DIAGNOSIS — D123 Benign neoplasm of transverse colon: Secondary | ICD-10-CM | POA: Diagnosis not present

## 2018-05-15 DIAGNOSIS — K635 Polyp of colon: Secondary | ICD-10-CM

## 2018-05-15 DIAGNOSIS — Z86010 Personal history of colonic polyps: Secondary | ICD-10-CM | POA: Insufficient documentation

## 2018-05-15 HISTORY — PX: COLONOSCOPY: SHX5424

## 2018-05-15 HISTORY — PX: POLYPECTOMY: SHX5525

## 2018-05-15 SURGERY — COLONOSCOPY
Anesthesia: Moderate Sedation

## 2018-05-15 MED ORDER — SODIUM CHLORIDE 0.9 % IV SOLN
INTRAVENOUS | Status: DC
  Administered 2018-05-15: 07:00:00 via INTRAVENOUS

## 2018-05-15 MED ORDER — MEPERIDINE HCL 50 MG/ML IJ SOLN
INTRAMUSCULAR | Status: AC
  Filled 2018-05-15: qty 1

## 2018-05-15 MED ORDER — MEPERIDINE HCL 100 MG/ML IJ SOLN
INTRAMUSCULAR | Status: DC | PRN
  Administered 2018-05-15: 25 mg via INTRAVENOUS

## 2018-05-15 MED ORDER — ONDANSETRON HCL 4 MG/2ML IJ SOLN
INTRAMUSCULAR | Status: DC | PRN
  Administered 2018-05-15: 4 mg via INTRAVENOUS

## 2018-05-15 MED ORDER — MIDAZOLAM HCL 5 MG/5ML IJ SOLN
INTRAMUSCULAR | Status: AC
  Filled 2018-05-15: qty 10

## 2018-05-15 MED ORDER — MIDAZOLAM HCL 5 MG/5ML IJ SOLN
INTRAMUSCULAR | Status: DC | PRN
  Administered 2018-05-15 (×2): 1 mg via INTRAVENOUS
  Administered 2018-05-15: 2 mg via INTRAVENOUS

## 2018-05-15 MED ORDER — ONDANSETRON HCL 4 MG/2ML IJ SOLN
INTRAMUSCULAR | Status: AC
  Filled 2018-05-15: qty 2

## 2018-05-15 NOTE — Op Note (Signed)
[facility] Patient Name: Erik Murray Procedure Date: 05/15/2018 7:08 AM MRN: [id number] Date of Birth: 08-06-1946 Attending MD: Norvel Richards , MD CSN: [id number] Age: 72 Admit Type: Outpatient Procedure:                Colonoscopy Indications:              High risk colon cancer surveillance: Personal                            history of colonic polyps Providers:                Norvel Richards, MD, Lurline Del, RN, Aram Candela Referring MD:              Medicines:                Midazolam 4 mg IV, Meperidine 25 mg IV Complications:            No immediate complications. Estimated Blood Loss:     Estimated blood loss was minimal. Procedure:                Pre-Anesthesia Assessment:                           - Prior to the procedure, a History and Physical                            was performed, and patient medications and                            allergies were reviewed. The patient's tolerance of                            previous anesthesia was also reviewed. The risks                            and benefits of the procedure and the sedation                            options and risks were discussed with the patient.                            All questions were answered, and informed consent                            was obtained. Prior Anticoagulants: The patient has                            taken no previous anticoagulant or antiplatelet                            agents. ASA Grade Assessment: II - A patient with  mild systemic disease. After reviewing the risks                            and benefits, the patient was deemed in                            satisfactory condition to undergo the procedure.                           After obtaining informed consent, the colonoscope                            was passed under direct vision. Throughout the                            procedure, the  patient's blood pressure, pulse, and                            oxygen saturations were monitored continuously. The                            [id number]-[id number] ([id number]) scope was introduced through                            the anus and advanced to the the cecum, identified                            by appendiceal orifice and ileocecal valve. The                            colonoscopy was performed without difficulty. The                            patient tolerated the procedure well. The quality                            of the bowel preparation was adequate. Scope In: 7:49:02 AM Scope Out: 8:09:08 AM Scope Withdrawal Time: 0 hours 14 minutes 29 seconds  Total Procedure Duration: 0 hours 20 minutes 6 seconds  Findings:      The perianal and digital rectal examinations were normal.      Three sessile polyps were found in the recto-sigmoid colon and splenic       flexure. The polyps were 4 to 5 mm in size. These polyps were removed       with a cold snare. Resection and retrieval were complete. Estimated       blood loss was minimal.      A few small-mouthed diverticula were found in the sigmoid colon and       descending colon.      The exam was otherwise without abnormality on direct and retroflexion       views. Impression:               - Three 4 to 5 mm polyps at the recto-sigmoid colon  and at the splenic flexure, removed with a cold                            snare. Resected and retrieved.                           - Diverticulosis in the sigmoid colon and in the                            descending colon.                           - The examination was otherwise normal on direct                            and retroflexion views. Moderate Sedation:      Moderate (conscious) sedation was administered by the endoscopy nurse       and supervised by the endoscopist. The following parameters were       monitored: oxygen saturation, heart rate, blood  pressure, respiratory       rate, EKG, adequacy of pulmonary ventilation, and response to care.       Total physician intraservice time was 26 minutes. Recommendation:           - Patient has a contact number available for                            emergencies. The signs and symptoms of potential                            delayed complications were discussed with the                            patient. Return to normal activities tomorrow.                            Written discharge instructions were provided to the                            patient.                           - Resume previous diet.                           - Continue present medications.                           - Repeat colonoscopy date to be determined after                            pending pathology results are reviewed for                            surveillance.                           -  Return to GI office (date not yet determined). Procedure Code(s):        --- Professional ---                           [phone], Colonoscopy, flexible; with removal of                            tumor(s), polyp(s), or other lesion(s) by snare                            technique                           G0500, Moderate sedation services provided by the                            same physician or other qualified health care                            professional performing a gastrointestinal                            endoscopic service that sedation supports,                            requiring the presence of an independent trained                            observer to assist in the monitoring of the                            patient's level of consciousness and physiological                            status; initial 15 minutes of intra-service time;                            patient age 16 years or older (additional time may                            be reported with [phone], as appropriate)                            [phone], Moderate sedation services provided by the                            same physician or other qualified health care                            professional performing the diagnostic or                            therapeutic service that the sedation supports,  requiring the presence of an independent trained                            observer to assist in the monitoring of the                            patient's level of consciousness and physiological                            status; each additional 15 minutes intraservice                            time (List separately in addition to code for                            primary service) Diagnosis Code(s):        --- Professional ---                           Z86.010, Personal history of colonic polyps                           D12.7, Benign neoplasm of rectosigmoid junction                           D12.3, Benign neoplasm of transverse colon (hepatic                            flexure or splenic flexure)                           K57.30, Diverticulosis of large intestine without                            perforation or abscess without bleeding CPT copyright 2017 American Medical Association. All rights reserved. The codes documented in this report are preliminary and upon coder review may  be revised to meet current compliance requirements. Cristopher Estimable. Ezekial Arns, MD Norvel Richards, MD 05/15/2018 8:19:04 AM This report has been signed electronically. Number of Addenda: 0

## 2018-05-15 NOTE — H&P (Signed)
@LOGO @   Primary Care Physician:  Asencion Noble, MD Primary Gastroenterologist:  Dr. Gala Romney  Pre-Procedure History & Physical: HPI:  Erik Murray is a 72 y.o. male here for history of multiple polyps removed 3 years ago.  No GI symptoms currently.  Past Medical History:  Diagnosis Date  . Arthritis   . Cancer [facility]) 2016   prostate  . Hx of adenomatous colonic polyps     Past Surgical History:  Procedure Laterality Date  . COLONOSCOPY  02/05/2012   ZOX:WRUEAVW and rectal polyps/few diverticulosis (adenomas). next TCS 01/2015  . COLONOSCOPY N/A 02/10/2015   Procedure: COLONOSCOPY;  Surgeon: Daneil Dolin, MD;  Location: [facility];  Service: Endoscopy;  Laterality: N/A;  . COLONOSCOPY    . EYE SURGERY Bilateral apirl, may 2013   cataract lens replacement  . LYMPHADENECTOMY Bilateral 10/01/2014   Procedure: LYMPHADENECTOMY;  Surgeon: Raynelle Bring, MD;  Location: [facility];  Service: Urology;  Laterality: Bilateral;  . PROSTATE BIOPSY  dec 2015  . ROBOT ASSISTED LAPAROSCOPIC RADICAL PROSTATECTOMY N/A 10/01/2014   Procedure: ROBOTIC ASSISTED LAPAROSCOPIC RADICAL PROSTATECTOMY LEVEL 2;  Surgeon: Raynelle Bring, MD;  Location: [facility];  Service: Urology;  Laterality: N/A;  . TONSILLECTOMY  as child  . TRIGGER FINGER RELEASE  10/10/2011   Procedure: RELEASE TRIGGER FINGER/A-1 PULLEY;  Surgeon: Wynonia Sours, MD;  Location: [place];  Service: Orthopedics;  Laterality: Right;  right thumb  . TRIGGER FINGER RELEASE Right 05/23/2016   Procedure: RELEASE TRIGGER FINGER/A-1 PULLEY right index finger;  Surgeon: Daryll Brod, MD;  Location: [place];  Service: Orthopedics;  Laterality: Right;  FAB  . TRIGGER FINGER RELEASE Left 07/18/2016   Procedure: RELEASE TRIGGER FINGER/A-1 PULLEY, left index;  Surgeon: Daryll Brod, MD;  Location: [place];  Service: Orthopedics;  Laterality: Left;  FAB  . YAG LASER APPLICATION Right 0/98/1191   Procedure: YAG  LASER APPLICATION;  Surgeon: Rutherford Guys, MD;  Location: [facility];  Service: Ophthalmology;  Laterality: Right;  . YAG LASER APPLICATION Left 4/78/2956   Procedure: YAG LASER APPLICATION;  Surgeon: Rutherford Guys, MD;  Location: [facility];  Service: Ophthalmology;  Laterality: Left;    Prior to Admission medications   Medication Sig Start Date End Date Taking? Authorizing Provider  Multiple Vitamins-Minerals (MULTIVITAMIN ADULT PO) Take 1 tablet by mouth daily.   Yes [provider]    Allergies as of 04/03/2018  . (No Known Allergies)    Family History  Problem Relation Age of Onset  . Diabetes Mother   . Heart disease Mother   . Other Father        age 47, deceased form of lupus  . Colon cancer Neg Hx     Social History   Socioeconomic History  . Marital status: Married    Spouse name: Not on file  . Number of children: 2  . Years of education: Not on file  . Highest education level: Not on file  Occupational History  . Not on file  Social Needs  . Financial resource strain: Not on file  . Food insecurity:    Worry: Not on file    Inability: Not on file  . Transportation needs:    Medical: Not on file    Non-medical: Not on file  Tobacco Use  . Smoking status: Former Smoker    Packs/day: 1.00    Years: 40.00    Pack years: 40.00    Types: Cigarettes  .  Smokeless tobacco: Former Systems developer    Quit date: 10/03/2005  Substance and Sexual Activity  . Alcohol use: Yes    Comment: occasional  . Drug use: No  . Sexual activity: Not on file  Lifestyle  . Physical activity:    Days per week: Not on file    Minutes per session: Not on file  . Stress: Not on file  Relationships  . Social connections:    Talks on phone: Not on file    Gets together: Not on file    Attends religious service: Not on file    Active member of club or organization: Not on file    Attends meetings of clubs or organizations: Not on file    Relationship status: Not on file  . Intimate  partner violence:    Fear of current or ex partner: Not on file    Emotionally abused: Not on file    Physically abused: Not on file    Forced sexual activity: Not on file  Other Topics Concern  . Not on file  Social History Narrative  . Not on file    Review of Systems: See HPI, otherwise negative ROS  Physical Exam: BP (!) 161/95   Pulse 82   Temp 97.9 F (36.6 C) (Oral)   Resp 14   Ht 5\' 10"  (1.778 m)   Wt 102.1 kg   SpO2 96%   BMI 32.28 kg/m  General:   Alert,  Well-developed, well-nourished, pleasant and cooperative in NAD Lungs:  Clear throughout to auscultation.   No wheezes, crackles, or rhonchi. No acute distress. Heart:  Regular rate and rhythm; no murmurs, clicks, rubs,  or gallops. Abdomen: Non-distended, normal bowel sounds.  Soft and nontender without appreciable mass or hepatosplenomegaly.  Pulses:  Normal pulses noted. Extremities:  Without clubbing or edema.  Impression/Plan: 72 year old gentleman history of multiple colonic adenomas removed previously.  Here for surveillance examination. The risks, benefits, limitations, alternatives and imponderables have been reviewed with the patient. Questions have been answered. All parties are agreeable.      Notice: This dictation was prepared with Dragon dictation along with smaller phrase technology. Any transcriptional errors that result from this process are unintentional and may not be corrected upon review.

## 2018-05-15 NOTE — Discharge Instructions (Signed)
Colon Polyps Polyps are tissue growths inside the body. Polyps can grow in many places, including the large intestine (colon). A polyp may be a round bump or a mushroom-shaped growth. You could have one polyp or several. Most colon polyps are noncancerous (benign). However, some colon polyps can become cancerous over time. What are the causes? The exact cause of colon polyps is not known. What increases the risk? This condition is more likely to develop in people who: Have a family history of colon cancer or colon polyps. Are older than 107 or older than 45 if they are African American. Have inflammatory bowel disease, such as ulcerative colitis or Crohn disease. Are overweight. Smoke cigarettes. Do not get enough exercise. Drink too much alcohol. Eat a diet that is: High in fat and red meat. Low in fiber. Had childhood cancer that was treated with abdominal radiation.  What are the signs or symptoms? Most polyps do not cause symptoms. If you have symptoms, they may include: Blood coming from your rectum when having a bowel movement. Blood in your stool.The stool may look dark red or black. A change in bowel habits, such as constipation or diarrhea.  How is this diagnosed? This condition is diagnosed with a colonoscopy. This is a procedure that uses a lighted, flexible scope to look at the inside of your colon. How is this treated? Treatment for this condition involves removing any polyps that are found. Those polyps will then be tested for cancer. If cancer is found, your health care provider will talk to you about options for colon cancer treatment. Follow these instructions at home: Diet Eat plenty of fiber, such as fruits, vegetables, and whole grains. Eat foods that are high in calcium and vitamin D, such as milk, cheese, yogurt, eggs, liver, fish, and broccoli. Limit foods high in fat, red meats, and processed meats, such as hot dogs, sausage, bacon, and lunch meats. Maintain  a healthy weight, or lose weight if recommended by your health care provider. General instructions Do not smoke cigarettes. Do not drink alcohol excessively. Keep all follow-up visits as told by your health care provider. This is important. This includes keeping regularly scheduled colonoscopies. Talk to your health care provider about when you need a colonoscopy. Exercise every day or as told by your health care provider. Contact a health care provider if: You have new or worsening bleeding during a bowel movement. You have new or increased blood in your stool. You have a change in bowel habits. You unexpectedly lose weight. This information is not intended to replace advice given to you by your health care provider. Make sure you discuss any questions you have with your health care provider. Document Released: 04/26/2004 Document Revised: 01/06/2016 Document Reviewed: 06/21/2015 Elsevier Interactive Patient Education  [employer]. Diverticulosis Diverticulosis is a condition that develops when small pouches (diverticula) form in the wall of the large intestine (colon). The colon is where water is absorbed and stool is formed. The pouches form when the inside layer of the colon pushes through weak spots in the outer layers of the colon. You may have a few pouches or many of them. What are the causes? The cause of this condition is not known. What increases the risk? The following factors may make you more likely to develop this condition: Being older than age 78. Your risk for this condition increases with age. Diverticulosis is rare among people younger than age 6. By age 29, many people have it. Eating a  low-fiber diet. Having frequent constipation. Being overweight. Not getting enough exercise. Smoking. Taking over-the-counter pain medicines, like aspirin and ibuprofen. Having a family history of diverticulosis.  What are the signs or symptoms? In most people, there are no  symptoms of this condition. If you do have symptoms, they may include: Bloating. Cramps in the abdomen. Constipation or diarrhea. Pain in the lower left side of the abdomen.  How is this diagnosed? This condition is most often diagnosed during an exam for other colon problems. Because diverticulosis usually has no symptoms, it often cannot be diagnosed independently. This condition may be diagnosed by: Using a flexible scope to examine the colon (colonoscopy). Taking an X-ray of the colon after dye has been put into the colon (barium enema). Doing a CT scan.  How is this treated? You may not need treatment for this condition if you have never developed an infection related to diverticulosis. If you have had an infection before, treatment may include: Eating a high-fiber diet. This may include eating more fruits, vegetables, and grains. Taking a fiber supplement. Taking a live bacteria supplement (probiotic). Taking medicine to relax your colon. Taking antibiotic medicines.  Follow these instructions at home: Drink 6-8 glasses of water or more each day to prevent constipation. Try not to strain when you have a bowel movement. If you have had an infection before: Eat more fiber as directed by your health care provider or your diet and nutrition specialist (dietitian). Take a fiber supplement or probiotic, if your health care provider approves. Take over-the-counter and prescription medicines only as told by your health care provider. If you were prescribed an antibiotic, take it as told by your health care provider. Do not stop taking the antibiotic even if you start to feel better. Keep all follow-up visits as told by your health care provider. This is important. Contact a health care provider if: You have pain in your abdomen. You have bloating. You have cramps. You have not had a bowel movement in 3 days. Get help right away if: Your pain gets worse. Your bloating becomes very  bad. You have a fever or chills, and your symptoms suddenly get worse. You vomit. You have bowel movements that are bloody or black. You have bleeding from your rectum. Summary Diverticulosis is a condition that develops when small pouches (diverticula) form in the wall of the large intestine (colon). You may have a few pouches or many of them. This condition is most often diagnosed during an exam for other colon problems. If you have had an infection related to diverticulosis, treatment may include increasing the fiber in your diet, taking supplements, or taking medicines. This information is not intended to replace advice given to you by your health care provider. Make sure you discuss any questions you have with your health care provider. Document Released: 04/27/2004 Document Revised: 06/19/2016 Document Reviewed: 06/19/2016 Elsevier Interactive Patient Education  2017 [employer].  Colonoscopy Discharge Instructions  Read the instructions outlined below and refer to this sheet in the next few weeks. These discharge instructions provide you with general information on caring for yourself after you leave the hospital. Your doctor may also give you specific instructions. While your treatment has been planned according to the most current medical practices available, unavoidable complications occasionally occur. If you have any problems or questions after discharge, call Dr. Gala Romney at [phone]. ACTIVITY  You may resume your regular activity, but move at a slower pace for the next 24 hours.  Take frequent rest periods for the next 24 hours.   Walking will help get rid of the air and reduce the bloated feeling in your belly (abdomen).   No driving for 24 hours (because of the medicine (anesthesia) used during the test).    Do not sign any important legal documents or operate any machinery for 24 hours (because of the anesthesia used during the test).  NUTRITION  Drink plenty of  fluids.   You may resume your normal diet as instructed by your doctor.   Begin with a light meal and progress to your normal diet. Heavy or fried foods are harder to digest and may make you feel sick to your stomach (nauseated).   Avoid alcoholic beverages for 24 hours or as instructed.  MEDICATIONS  You may resume your normal medications unless your doctor tells you otherwise.  WHAT YOU CAN EXPECT TODAY  Some feelings of bloating in the abdomen.   Passage of more gas than usual.   Spotting of blood in your stool or on the toilet paper.  IF YOU HAD POLYPS REMOVED DURING THE COLONOSCOPY:  No aspirin products for 7 days or as instructed.   No alcohol for 7 days or as instructed.   Eat a soft diet for the next 24 hours.  FINDING OUT THE RESULTS OF YOUR TEST Not all test results are available during your visit. If your test results are not back during the visit, make an appointment with your caregiver to find out the results. Do not assume everything is normal if you have not heard from your caregiver or the medical facility. It is important for you to follow up on all of your test results.  SEEK IMMEDIATE MEDICAL ATTENTION IF:  You have more than a spotting of blood in your stool.   Your belly is swollen (abdominal distention).   You are nauseated or vomiting.   You have a temperature over 101.   You have abdominal pain or discomfort that is severe or gets worse throughout the day.    Diverticulosis and colon polyp information provided  Further recommendations to follow pending review of pathology report

## 2018-05-17 ENCOUNTER — Encounter: Payer: Self-pay | Admitting: Internal Medicine

## 2018-05-20 ENCOUNTER — Encounter (HOSPITAL_COMMUNITY): Payer: Self-pay | Admitting: Internal Medicine

## 2018-09-30 ENCOUNTER — Emergency Department (HOSPITAL_COMMUNITY)
Admission: EM | Admit: 2018-09-30 | Discharge: 2018-09-30 | Disposition: A | Discharge status: Home / Self Care | Payer: Medicare Other | Attending: Emergency Medicine | Admitting: Emergency Medicine

## 2018-09-30 ENCOUNTER — Emergency Department (HOSPITAL_COMMUNITY): Payer: Medicare Other

## 2018-09-30 ENCOUNTER — Encounter (HOSPITAL_COMMUNITY): Payer: Self-pay | Admitting: *Deleted

## 2018-09-30 ENCOUNTER — Other Ambulatory Visit: Payer: Self-pay

## 2018-09-30 DIAGNOSIS — Y92410 Unspecified street and highway as the place of occurrence of the external cause: Secondary | ICD-10-CM | POA: Diagnosis not present

## 2018-09-30 DIAGNOSIS — Z23 Encounter for immunization: Secondary | ICD-10-CM | POA: Diagnosis not present

## 2018-09-30 DIAGNOSIS — M542 Cervicalgia: Secondary | ICD-10-CM | POA: Diagnosis not present

## 2018-09-30 DIAGNOSIS — Z87891 Personal history of nicotine dependence: Secondary | ICD-10-CM | POA: Insufficient documentation

## 2018-09-30 DIAGNOSIS — Y999 Unspecified external cause status: Secondary | ICD-10-CM | POA: Insufficient documentation

## 2018-09-30 DIAGNOSIS — S40022A Contusion of left upper arm, initial encounter: Secondary | ICD-10-CM

## 2018-09-30 DIAGNOSIS — Y9389 Activity, other specified: Secondary | ICD-10-CM | POA: Diagnosis not present

## 2018-09-30 DIAGNOSIS — S0081XA Abrasion of other part of head, initial encounter: Secondary | ICD-10-CM | POA: Diagnosis not present

## 2018-09-30 DIAGNOSIS — Z79899 Other long term (current) drug therapy: Secondary | ICD-10-CM | POA: Diagnosis not present

## 2018-09-30 DIAGNOSIS — S0990XA Unspecified injury of head, initial encounter: Secondary | ICD-10-CM | POA: Diagnosis present

## 2018-09-30 DIAGNOSIS — Z8546 Personal history of malignant neoplasm of prostate: Secondary | ICD-10-CM | POA: Insufficient documentation

## 2018-09-30 LAB — COMPREHENSIVE METABOLIC PANEL
ALBUMIN: 4 g/dL (ref 3.5–5.0)
ALT: 22 U/L (ref 0–44)
ANION GAP: 10 (ref 5–15)
AST: 24 U/L (ref 15–41)
Alkaline Phosphatase: 49 U/L (ref 38–126)
BUN: 14 mg/dL (ref 8–23)
CHLORIDE: 106 mmol/L (ref 98–111)
CO2: 24 mmol/L (ref 22–32)
Calcium: 8.9 mg/dL (ref 8.9–10.3)
Creatinine, Ser: 1.16 mg/dL (ref 0.61–1.24)
GFR calc Af Amer: >60 mL/min (ref >60)
GFR calc non Af Amer: >60 mL/min (ref >60)
Glucose, Bld: 108 mg/dL — ABNORMAL HIGH (ref 70–99)
POTASSIUM: 3.8 mmol/L (ref 3.5–5.1)
SODIUM: 140 mmol/L (ref 135–145)
Total Bilirubin: 1.2 mg/dL (ref 0.3–1.2)
Total Protein: 6.7 g/dL (ref 6.5–8.1)

## 2018-09-30 LAB — CBC WITH DIFFERENTIAL/PLATELET
Abs Immature Granulocytes: 0.03 10*3/uL (ref 0.00–0.07)
BASOS ABS: 0.1 10*3/uL (ref 0.0–0.1)
BASOS PCT: 1 %
EOS ABS: 0.2 10*3/uL (ref 0.0–0.5)
Eosinophils Relative: 2 %
HCT: 47.9 % (ref 39.0–52.0)
Hemoglobin: 16 g/dL (ref 13.0–17.0)
IMMATURE GRANULOCYTES: 0 %
LYMPHS ABS: 2.1 10*3/uL (ref 0.7–4.0)
Lymphocytes Relative: 20 %
MCH: 31.3 pg (ref 26.0–34.0)
MCHC: 33.4 g/dL (ref 30.0–36.0)
MCV: 93.6 fL (ref 80.0–100.0)
Monocytes Absolute: 0.9 10*3/uL (ref 0.1–1.0)
Monocytes Relative: 8 %
NEUTROS PCT: 69 %
NRBC: 0 % (ref 0.0–0.2)
Neutro Abs: 7.4 10*3/uL (ref 1.7–7.7)
PLATELETS: 167 10*3/uL (ref 150–400)
RBC: 5.12 MIL/uL (ref 4.22–5.81)
RDW: 12.7 % (ref 11.5–15.5)
WBC: 10.7 10*3/uL — AB (ref 4.0–10.5)

## 2018-09-30 LAB — PROTIME-INR
INR: 0.94
PROTHROMBIN TIME: 12.5 s (ref 11.4–15.2)

## 2018-09-30 LAB — ABO/RH: ABO/RH(D): O POS

## 2018-09-30 LAB — TYPE AND SCREEN
ABO/RH(D): O POS
Antibody Screen: NEGATIVE

## 2018-09-30 LAB — ETHANOL

## 2018-09-30 MED ORDER — FENTANYL CITRATE (PF) 100 MCG/2ML IJ SOLN
100.0000 ug | Freq: Once | INTRAMUSCULAR | Status: AC
  Administered 2018-09-30: 100 ug via INTRAVENOUS
  Filled 2018-09-30: qty 2

## 2018-09-30 MED ORDER — TETANUS-DIPHTH-ACELL PERTUSSIS 5-2.5-18.5 LF-MCG/0.5 IM SUSP
0.5000 mL | Freq: Once | INTRAMUSCULAR | Status: AC
  Administered 2018-09-30: 0.5 mL via INTRAMUSCULAR
  Filled 2018-09-30: qty 0.5

## 2018-09-30 MED ORDER — NAPROXEN 250 MG PO TABS
500.0000 mg | ORAL_TABLET | Freq: Once | ORAL | Status: AC
  Administered 2018-09-30: 500 mg via ORAL
  Filled 2018-09-30: qty 2

## 2018-09-30 MED ORDER — HYDROCODONE-ACETAMINOPHEN 5-325 MG PO TABS: 1.0000 | ORAL_TABLET | Freq: Four times a day (QID) | ORAL | 0 refills | Status: DC | PRN

## 2018-09-30 MED ORDER — HYDROCODONE-ACETAMINOPHEN 5-325 MG PO TABS
2.0000 | ORAL_TABLET | Freq: Once | ORAL | Status: AC
  Administered 2018-09-30: 2 via ORAL
  Filled 2018-09-30: qty 2

## 2018-09-30 NOTE — ED Notes (Signed)
Miller, MD at bedside.  

## 2018-09-30 NOTE — ED Notes (Signed)
Pt returned from CT. Family at bedside

## 2018-09-30 NOTE — Discharge Instructions (Signed)
Your testing was unremarkable showing no signs of broken bones, use the sling for comfort, naproxen twice a day as needed for pain, ice and elevate your arm, seek medical exam within 3 days for recheck

## 2018-09-30 NOTE — ED Notes (Signed)
Pt to xray at this time.
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
## 2018-09-30 NOTE — ED Notes (Signed)
Pt discharged and is in room with his wife.  Family also at bedside
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
## 2020-01-29 DIAGNOSIS — C61 Malignant neoplasm of prostate: Secondary | ICD-10-CM | POA: Diagnosis not present

## 2020-02-05 DIAGNOSIS — C61 Malignant neoplasm of prostate: Secondary | ICD-10-CM | POA: Diagnosis not present

## 2020-02-07 DIAGNOSIS — C61 Malignant neoplasm of prostate: Secondary | ICD-10-CM | POA: Diagnosis not present

## 2020-03-08 DIAGNOSIS — D075 Carcinoma in situ of prostate: Secondary | ICD-10-CM | POA: Diagnosis not present

## 2020-03-08 DIAGNOSIS — Z125 Encounter for screening for malignant neoplasm of prostate: Secondary | ICD-10-CM | POA: Diagnosis not present

## 2020-03-08 DIAGNOSIS — R7301 Impaired fasting glucose: Secondary | ICD-10-CM | POA: Diagnosis not present

## 2020-03-08 DIAGNOSIS — E785 Hyperlipidemia, unspecified: Secondary | ICD-10-CM | POA: Diagnosis not present

## 2020-03-08 DIAGNOSIS — Z79899 Other long term (current) drug therapy: Secondary | ICD-10-CM | POA: Diagnosis not present

## 2020-03-15 DIAGNOSIS — Z8546 Personal history of malignant neoplasm of prostate: Secondary | ICD-10-CM | POA: Diagnosis not present

## 2020-03-15 DIAGNOSIS — E785 Hyperlipidemia, unspecified: Secondary | ICD-10-CM | POA: Diagnosis not present

## 2020-03-15 DIAGNOSIS — R7301 Impaired fasting glucose: Secondary | ICD-10-CM | POA: Diagnosis not present

## 2020-03-15 DIAGNOSIS — Z0001 Encounter for general adult medical examination with abnormal findings: Secondary | ICD-10-CM | POA: Diagnosis not present

## 2020-03-15 DIAGNOSIS — I493 Ventricular premature depolarization: Secondary | ICD-10-CM | POA: Diagnosis not present

## 2020-05-04 DIAGNOSIS — C61 Malignant neoplasm of prostate: Secondary | ICD-10-CM | POA: Diagnosis not present

## 2020-11-03 DIAGNOSIS — M1812 Unilateral primary osteoarthritis of first carpometacarpal joint, left hand: Secondary | ICD-10-CM | POA: Diagnosis not present

## 2020-11-03 DIAGNOSIS — M65332 Trigger finger, left middle finger: Secondary | ICD-10-CM | POA: Diagnosis not present

## 2020-11-19 DIAGNOSIS — M79645 Pain in left finger(s): Secondary | ICD-10-CM | POA: Diagnosis not present

## 2020-11-19 DIAGNOSIS — M1812 Unilateral primary osteoarthritis of first carpometacarpal joint, left hand: Secondary | ICD-10-CM | POA: Diagnosis not present

## 2020-11-19 DIAGNOSIS — G8929 Other chronic pain: Secondary | ICD-10-CM | POA: Diagnosis not present

## 2020-11-19 DIAGNOSIS — M65322 Trigger finger, left index finger: Secondary | ICD-10-CM | POA: Diagnosis not present

## 2020-12-15 DIAGNOSIS — M65332 Trigger finger, left middle finger: Secondary | ICD-10-CM | POA: Diagnosis not present

## 2021-03-08 DIAGNOSIS — Z8546 Personal history of malignant neoplasm of prostate: Secondary | ICD-10-CM | POA: Diagnosis not present

## 2021-03-08 DIAGNOSIS — R7301 Impaired fasting glucose: Secondary | ICD-10-CM | POA: Diagnosis not present

## 2021-03-08 DIAGNOSIS — Z79899 Other long term (current) drug therapy: Secondary | ICD-10-CM | POA: Diagnosis not present

## 2021-03-08 DIAGNOSIS — E785 Hyperlipidemia, unspecified: Secondary | ICD-10-CM | POA: Diagnosis not present

## 2021-03-15 DIAGNOSIS — Z6832 Body mass index (BMI) 32.0-32.9, adult: Secondary | ICD-10-CM | POA: Diagnosis not present

## 2021-03-15 DIAGNOSIS — I493 Ventricular premature depolarization: Secondary | ICD-10-CM | POA: Diagnosis not present

## 2021-03-15 DIAGNOSIS — Z8546 Personal history of malignant neoplasm of prostate: Secondary | ICD-10-CM | POA: Diagnosis not present

## 2021-03-15 DIAGNOSIS — E785 Hyperlipidemia, unspecified: Secondary | ICD-10-CM | POA: Diagnosis not present

## 2021-03-15 DIAGNOSIS — D696 Thrombocytopenia, unspecified: Secondary | ICD-10-CM | POA: Diagnosis not present

## 2021-03-15 DIAGNOSIS — R7301 Impaired fasting glucose: Secondary | ICD-10-CM | POA: Diagnosis not present

## 2021-03-18 ENCOUNTER — Other Ambulatory Visit: Payer: Self-pay | Admitting: Orthopedic Surgery

## 2021-03-18 DIAGNOSIS — M1812 Unilateral primary osteoarthritis of first carpometacarpal joint, left hand: Secondary | ICD-10-CM | POA: Diagnosis not present

## 2021-03-18 DIAGNOSIS — M65332 Trigger finger, left middle finger: Secondary | ICD-10-CM | POA: Diagnosis not present

## 2021-03-21 ENCOUNTER — Encounter (HOSPITAL_BASED_OUTPATIENT_CLINIC_OR_DEPARTMENT_OTHER): Payer: Self-pay | Admitting: Orthopedic Surgery

## 2021-03-24 NOTE — Progress Notes (Signed)

## 2021-03-29 ENCOUNTER — Other Ambulatory Visit: Payer: Self-pay

## 2021-03-29 ENCOUNTER — Ambulatory Visit (HOSPITAL_BASED_OUTPATIENT_CLINIC_OR_DEPARTMENT_OTHER): Payer: Medicare PPO | Admitting: Anesthesiology

## 2021-03-29 ENCOUNTER — Ambulatory Visit (HOSPITAL_BASED_OUTPATIENT_CLINIC_OR_DEPARTMENT_OTHER)
Admission: RE | Admit: 2021-03-29 | Discharge: 2021-03-29 | Disposition: A | Discharge status: Home / Self Care | Payer: Medicare PPO | Attending: Orthopedic Surgery | Admitting: Orthopedic Surgery

## 2021-03-29 ENCOUNTER — Encounter (HOSPITAL_BASED_OUTPATIENT_CLINIC_OR_DEPARTMENT_OTHER)
Admission: RE | Disposition: A | Discharge status: Home / Self Care | Payer: Self-pay | Source: Home / Self Care | Attending: Orthopedic Surgery

## 2021-03-29 ENCOUNTER — Encounter (HOSPITAL_BASED_OUTPATIENT_CLINIC_OR_DEPARTMENT_OTHER): Payer: Self-pay | Admitting: Orthopedic Surgery

## 2021-03-29 DIAGNOSIS — M65332 Trigger finger, left middle finger: Secondary | ICD-10-CM | POA: Diagnosis not present

## 2021-03-29 DIAGNOSIS — C61 Malignant neoplasm of prostate: Secondary | ICD-10-CM | POA: Diagnosis not present

## 2021-03-29 DIAGNOSIS — Z87891 Personal history of nicotine dependence: Secondary | ICD-10-CM | POA: Insufficient documentation

## 2021-03-29 DIAGNOSIS — M65842 Other synovitis and tenosynovitis, left hand: Secondary | ICD-10-CM | POA: Insufficient documentation

## 2021-03-29 DIAGNOSIS — Z8546 Personal history of malignant neoplasm of prostate: Secondary | ICD-10-CM | POA: Diagnosis not present

## 2021-03-29 HISTORY — PX: TRIGGER FINGER RELEASE: SHX641

## 2021-03-29 SURGERY — RELEASE, A1 PULLEY, FOR TRIGGER FINGER
Anesthesia: General | Site: Hand | Laterality: Left

## 2021-03-29 MED ORDER — ACETAMINOPHEN 325 MG PO TABS: 325.0000 mg | ORAL_TABLET | ORAL | Status: DC | PRN

## 2021-03-29 MED ORDER — CEFAZOLIN SODIUM-DEXTROSE 2-4 GM/100ML-% IV SOLN
INTRAVENOUS | Status: AC
  Filled 2021-03-29: qty 100

## 2021-03-29 MED ORDER — TRAMADOL HCL 50 MG PO TABS
50.0000 mg | ORAL_TABLET | Freq: Four times a day (QID) | ORAL | 0 refills | Status: DC | PRN
Start: 2021-03-29 — End: 2021-10-28

## 2021-03-29 MED ORDER — FENTANYL CITRATE (PF) 100 MCG/2ML IJ SOLN: 25.0000 ug | INTRAMUSCULAR | Status: DC | PRN

## 2021-03-29 MED ORDER — PROPOFOL 10 MG/ML IV BOLUS
INTRAVENOUS | Status: DC | PRN
  Administered 2021-03-29: 150 mg via INTRAVENOUS

## 2021-03-29 MED ORDER — ONDANSETRON HCL 4 MG/2ML IJ SOLN: 4.0000 mg | Freq: Once | INTRAMUSCULAR | Status: DC | PRN

## 2021-03-29 MED ORDER — BUPIVACAINE HCL (PF) 0.25 % IJ SOLN
INTRAMUSCULAR | Status: DC | PRN
  Administered 2021-03-29: 5 mL

## 2021-03-29 MED ORDER — ONDANSETRON HCL 4 MG/2ML IJ SOLN
INTRAMUSCULAR | Status: DC | PRN
  Administered 2021-03-29: 4 mg via INTRAVENOUS

## 2021-03-29 MED ORDER — MEPERIDINE HCL 25 MG/ML IJ SOLN: 6.2500 mg | INTRAMUSCULAR | Status: DC | PRN

## 2021-03-29 MED ORDER — CEFAZOLIN SODIUM-DEXTROSE 2-4 GM/100ML-% IV SOLN
2.0000 g | INTRAVENOUS | Status: AC
  Administered 2021-03-29: 2 g via INTRAVENOUS

## 2021-03-29 MED ORDER — OXYCODONE HCL 5 MG/5ML PO SOLN: 5.0000 mg | Freq: Once | ORAL | Status: DC | PRN

## 2021-03-29 MED ORDER — ONDANSETRON HCL 4 MG/2ML IJ SOLN
INTRAMUSCULAR | Status: AC
  Filled 2021-03-29: qty 2

## 2021-03-29 MED ORDER — FENTANYL CITRATE (PF) 100 MCG/2ML IJ SOLN
INTRAMUSCULAR | Status: DC | PRN
  Administered 2021-03-29: 100 ug via INTRAVENOUS

## 2021-03-29 MED ORDER — 0.9 % SODIUM CHLORIDE (POUR BTL) OPTIME
TOPICAL | Status: DC | PRN
  Administered 2021-03-29: 50 mL

## 2021-03-29 MED ORDER — OXYCODONE HCL 5 MG PO TABS: 5.0000 mg | ORAL_TABLET | Freq: Once | ORAL | Status: DC | PRN

## 2021-03-29 MED ORDER — ACETAMINOPHEN 160 MG/5ML PO SOLN: 325.0000 mg | ORAL | Status: DC | PRN

## 2021-03-29 MED ORDER — LACTATED RINGERS IV SOLN: INTRAVENOUS | Status: DC

## 2021-03-29 MED ORDER — LIDOCAINE HCL (CARDIAC) PF 100 MG/5ML IV SOSY
PREFILLED_SYRINGE | INTRAVENOUS | Status: DC | PRN
  Administered 2021-03-29: 60 mg via INTRAVENOUS

## 2021-03-29 MED ORDER — FENTANYL CITRATE (PF) 100 MCG/2ML IJ SOLN
INTRAMUSCULAR | Status: AC
  Filled 2021-03-29: qty 2

## 2021-03-29 SURGICAL SUPPLY — 35 items
APL PRP STRL LF DISP 70% ISPRP (MISCELLANEOUS) ×1
BLADE SURG 15 STRL LF DISP TIS (BLADE) ×1 IMPLANT
BLADE SURG 15 STRL SS (BLADE) ×2
BNDG CMPR 9X4 STRL LF SNTH (GAUZE/BANDAGES/DRESSINGS) ×1
BNDG COHESIVE 2X5 TAN ST LF (GAUZE/BANDAGES/DRESSINGS) ×2 IMPLANT
BNDG ESMARK 4X9 LF (GAUZE/BANDAGES/DRESSINGS) ×1 IMPLANT
CHLORAPREP W/TINT 26 (MISCELLANEOUS) ×2 IMPLANT
CORD BIPOLAR FORCEPS 12FT (ELECTRODE) IMPLANT
COVER BACK TABLE 60X90IN (DRAPES) ×2 IMPLANT
COVER MAYO STAND STRL (DRAPES) ×2 IMPLANT
CUFF TOURN SGL QUICK 18X4 (TOURNIQUET CUFF) ×1 IMPLANT
DECANTER SPIKE VIAL GLASS SM (MISCELLANEOUS) IMPLANT
DRAPE EXTREMITY T 121X128X90 (DISPOSABLE) ×2 IMPLANT
DRAPE SURG 17X23 STRL (DRAPES) ×2 IMPLANT
GAUZE 4X4 16PLY ~~LOC~~+RFID DBL (SPONGE) ×1 IMPLANT
GAUZE SPONGE 4X4 12PLY STRL (GAUZE/BANDAGES/DRESSINGS) ×2 IMPLANT
GAUZE XEROFORM 1X8 LF (GAUZE/BANDAGES/DRESSINGS) ×2 IMPLANT
GLOVE SURG ORTHO LTX SZ8 (GLOVE) ×2 IMPLANT
GLOVE SURG POLYISO LF SZ6.5 (GLOVE) ×1 IMPLANT
GLOVE SURG UNDER POLY LF SZ6.5 (GLOVE) ×1 IMPLANT
GLOVE SURG UNDER POLY LF SZ7 (GLOVE) ×1 IMPLANT
GLOVE SURG UNDER POLY LF SZ8.5 (GLOVE) ×2 IMPLANT
GOWN STRL REUS W/ TWL LRG LVL3 (GOWN DISPOSABLE) ×1 IMPLANT
GOWN STRL REUS W/TWL LRG LVL3 (GOWN DISPOSABLE) ×2
GOWN STRL REUS W/TWL XL LVL3 (GOWN DISPOSABLE) ×2 IMPLANT
NDL PRECISIONGLIDE 27X1.5 (NEEDLE) ×1 IMPLANT
NEEDLE PRECISIONGLIDE 27X1.5 (NEEDLE) ×2 IMPLANT
NS IRRIG 1000ML POUR BTL (IV SOLUTION) ×2 IMPLANT
PACK BASIN DAY SURGERY FS (CUSTOM PROCEDURE TRAY) ×2 IMPLANT
STOCKINETTE 4X48 STRL (DRAPES) ×2 IMPLANT
SUT ETHILON 4 0 PS 2 18 (SUTURE) ×2 IMPLANT
SYR BULB EAR ULCER 3OZ GRN STR (SYRINGE) ×2 IMPLANT
SYR CONTROL 10ML LL (SYRINGE) ×2 IMPLANT
TOWEL GREEN STERILE FF (TOWEL DISPOSABLE) ×3 IMPLANT
UNDERPAD 30X36 HEAVY ABSORB (UNDERPADS AND DIAPERS) ×1 IMPLANT

## 2021-03-29 NOTE — H&P (Signed)
Erik Murray is an 75 y.o. male.   Chief Complaint: catching left middle finger HPI: Erik Murray  is 75 years old right-handed he was last seen on 07/31/2016 following a trigger finger release left index. He is now complaining of catching of his left middle finger and pain in the basilar joint of his left thumb. The triggering has been going on for approximately 2 months. Has no history of injury. States that the gripping and pinching of his hand on that side causes pain at the bottom area of his thumb moderate in nature. Occasionally sharp with gripping. He does not have to use the opposite hand to straighten his left middle finger out. He states nothing seems to make the catching better or worse on his middle finger. He has no history of diabetes thyroid problems arthritis or gout. Family history is negative for each of these also. He was last seen on 12/15/2020. He had an injection to the A1 pulley at that time this was his second injection. He states that he continues to catch on him he has no history of injury he still has to use his opposite hand to straighten it  Past Medical History:  Diagnosis Date   Arthritis    Cancer ([place]) 2016   prostate   Hx of adenomatous colonic polyps     Past Surgical History:  Procedure Laterality Date   COLONOSCOPY  02/05/2012   [id number] and rectal polyps/few diverticulosis (adenomas). next TCS 01/2015   COLONOSCOPY N/A 02/10/2015   Procedure: COLONOSCOPY;  Surgeon: Daneil Dolin, MD;  [facility];  Service: Endoscopy;  Laterality: N/A;   COLONOSCOPY     COLONOSCOPY N/A 05/15/2018   Procedure: COLONOSCOPY;  Surgeon: Daneil Dolin, MD;  [facility];  Service: Endoscopy;  Laterality: N/A;  7:30   EYE SURGERY Bilateral apirl, may 2013   cataract lens replacement   LYMPHADENECTOMY Bilateral 10/01/2014   Procedure: LYMPHADENECTOMY;  Surgeon: Raynelle Bring, MD;  Location: [facility];  Service: Urology;  Laterality: Bilateral;   POLYPECTOMY   05/15/2018   Procedure: POLYPECTOMY;  Surgeon: Daneil Dolin, MD;  [facility];  Service: Endoscopy;;  recto-sigmoid colon x2; splenic flexure(cs);   PROSTATE BIOPSY  dec 2015   ROBOT ASSISTED LAPAROSCOPIC RADICAL PROSTATECTOMY N/A 10/01/2014   Procedure: ROBOTIC ASSISTED LAPAROSCOPIC RADICAL PROSTATECTOMY LEVEL 2;  Surgeon: Raynelle Bring, MD;  Location: [facility];  Service: Urology;  Laterality: N/A;   TONSILLECTOMY  as child   TRIGGER FINGER RELEASE  10/10/2011   Procedure: RELEASE TRIGGER FINGER/A-1 PULLEY;  Surgeon: Wynonia Sours, MD;  Location: [facility];  Service: Orthopedics;  Laterality: Right;  right thumb   TRIGGER FINGER RELEASE Right 05/23/2016   Procedure: RELEASE TRIGGER FINGER/A-1 PULLEY right index finger;  Surgeon: Daryll Brod, MD;  Location: [facility];  Service: Orthopedics;  Laterality: Right;  FAB   TRIGGER FINGER RELEASE Left 07/18/2016   Procedure: RELEASE TRIGGER FINGER/A-1 PULLEY, left index;  Surgeon: Daryll Brod, MD;  Location: [facility];  Service: Orthopedics;  Laterality: Left;  FAB   YAG LASER APPLICATION Right [id number]   Procedure: YAG LASER APPLICATION;  Surgeon: Rutherford Guys, MD;  Location: [facility];  Service: Ophthalmology;  Laterality: Right;   YAG LASER APPLICATION Left [id number]   Procedure: YAG LASER APPLICATION;  Surgeon: Rutherford Guys, MD;  Location: [facility];  Service: Ophthalmology;  Laterality: Left;    Family History  Problem Relation Age of Onset  Diabetes Mother    Heart disease Mother    Other Father        age 38, deceased form of lupus   Colon cancer Neg Hx    Social History:  reports that he has quit smoking. His smoking use included cigarettes. He has a 40.00 pack-year smoking history. He quit smokeless tobacco use about 15 years ago. He reports current alcohol use. He reports that he does not use drugs.  Allergies: Not on File  Medications Prior to Admission  Medication Sig Dispense  Refill   multivitamin (ONE-A-DAY MEN'S) TABS tablet Take 1 tablet by mouth daily.      No results found for this or any previous visit (from the past 48 hour(s)).  No results found.   Pertinent items are noted in HPI.  Height '5\' 10"'$  (1.778 m), weight 97.5 kg.  General appearance: alert, cooperative, and appears stated age Head: Normocephalic, without obvious abnormality Neck: no JVD Resp: clear to auscultation bilaterally Cardio: regular rate and rhythm, S1, S2 normal, no murmur, click, rub or gallop GI: soft, non-tender; bowel sounds normal; no masses,  no organomegaly Extremities:  catching left middle finger Pulses: 2+ and symmetric Skin: Skin color, texture, turgor normal. No rashes or lesions Neurologic: Grossly normal Incision/Wound: na  Assessment/Plan Diagnose stenosing tenosynovitis left middle finger  Plan: Would like proceed to have this surgically released. Pre-. Postoperative course been discussed along with risk and complications. He is aware that there is no guarantee to the surgery the possibility of infection recurrence injury to arteries nerves tendons incomplete relief symptoms dystrophy is scheduled for release A1 pulley left middle finger as an outpatient under regional anesthesia. Daryll Brod 03/29/2021, 11:29 AM

## 2021-03-29 NOTE — Anesthesia Procedure Notes (Signed)
Procedure Name: LMA Insertion Date/Time: 03/29/2021 12:48 PM Performed by: Maryella Shivers, CRNA Pre-anesthesia Checklist: Patient identified, Emergency Drugs available, Suction available and Patient being monitored Patient Re-evaluated:Patient Re-evaluated prior to induction Oxygen Delivery Method: Circle system utilized Preoxygenation: Pre-oxygenation with 100% oxygen Induction Type: IV induction Ventilation: Mask ventilation without difficulty LMA: LMA inserted LMA Size: 5.0 Number of attempts: 1 Airway Equipment and Method: Bite block Placement Confirmation: positive ETCO2 Tube secured with: Tape Dental Injury: Teeth and Oropharynx as per pre-operative assessment

## 2021-03-29 NOTE — Anesthesia Preprocedure Evaluation (Addendum)
Anesthesia Evaluation  Patient identified by MRN, date of birth, ID band Patient awake    Reviewed: Allergy & Precautions, NPO status , Patient's Chart, lab work & pertinent test results  Airway Mallampati: II  TM Distance: >3 FB Neck ROM: Full    Dental  (+) Teeth Intact, Dental Advisory Given   Pulmonary former smoker,    breath sounds clear to auscultation       Cardiovascular negative cardio ROS   Rhythm:Regular Rate:Normal     Neuro/Psych negative neurological ROS  negative psych ROS   GI/Hepatic negative GI ROS, Neg liver ROS,   Endo/Other  negative endocrine ROS  Renal/GU negative Renal ROS  negative genitourinary   Musculoskeletal  (+) Arthritis , Osteoarthritis,    Abdominal   Peds negative pediatric ROS (+)  Hematology negative hematology ROS (+)   Anesthesia Other Findings   Reproductive/Obstetrics negative OB ROS                             Anesthesia Physical  Anesthesia Plan  ASA: 2  Anesthesia Plan: General   Post-op Pain Management:    Induction: Intravenous  PONV Risk Score and Plan: Propofol infusion and Ondansetron  Airway Management Planned: LMA  Additional Equipment: None  Intra-op Plan:   Post-operative Plan:   Informed Consent: I have reviewed the patients History and Physical, chart, labs and discussed the procedure including the risks, benefits and alternatives for the proposed anesthesia with the patient or authorized representative who has indicated his/her understanding and acceptance.       Plan Discussed with: CRNA and Anesthesiologist  Anesthesia Plan Comments: ( )       Anesthesia Quick Evaluation

## 2021-03-29 NOTE — Brief Op Note (Signed)
03/29/2021  1:11 PM  PATIENT:  Erik Murray  75 y.o. male  PRE-OPERATIVE DIAGNOSIS:  TRIGGER LEFT MIDDLE FINGER  POST-OPERATIVE DIAGNOSIS:  TRIGGER LEFT MIDDLE FINGER  PROCEDURE:  Procedure(s): RELEASE TRIGGER FINGER/A-1 PULLEY LEFT MIDDLE FINGER (Left)  SURGEON:  Surgeon(s) and Role:    * Daryll Brod, MD - Primary  PHYSICIAN ASSISTANT:   ASSISTANTS: none   ANESTHESIA:   local and general  EBL:  55m  BLOOD ADMINISTERED:none  DRAINS: none   LOCAL MEDICATIONS USED:  BUPIVICAINE   SPECIMEN:  No Specimen  DISPOSITION OF SPECIMEN:  PATHOLOGY  COUNTS:  YES  TOURNIQUET:   Total Tourniquet Time Documented: Forearm (Left) - 11 minutes Total: Forearm (Left) - 11 minutes   DICTATION: .DViviann SpareDictation  PLAN OF CARE: Discharge to home after PACU  PATIENT DISPOSITION:  PACU - hemodynamically stable.

## 2021-03-29 NOTE — Transfer of Care (Signed)
Immediate Anesthesia Transfer of Care Note  Patient: CLAIR DIAB  Procedure(s) Performed: RELEASE TRIGGER FINGER/A-1 PULLEY LEFT MIDDLE FINGER (Left: Hand)  Patient Location: PACU  Anesthesia Type:General  Level of Consciousness: sedated  Airway & Oxygen Therapy: Patient Spontanous Breathing and Patient connected to face mask oxygen  Post-op Assessment: Report given to RN and Post -op Vital signs reviewed and stable  Post vital signs: Reviewed and stable  Last Vitals:  Vitals Value Taken Time  BP 136/80 03/29/21 1315  Temp    Pulse 57 03/29/21 1320  Resp 11 03/29/21 1320  SpO2 99 % 03/29/21 1320  Vitals shown include unvalidated device data.  Last Pain:  Vitals:   03/29/21 1129  TempSrc: Oral  PainSc: 0-No pain         Complications: No notable events documented.

## 2021-03-29 NOTE — Op Note (Signed)
NAME: Erik Murray RECORD NO: [id number] DATE OF BIRTH: September 25, 1945 FACILITY: [facility] LOCATION: [facility] PHYSICIAN: Erik Sours, MD   OPERATIVE REPORT   DATE OF PROCEDURE: 03/29/21    PREOPERATIVE DIAGNOSIS: Stenosing tenosynovitis left middle finger   POSTOPERATIVE DIAGNOSIS: Same   PROCEDURE: Release A1 pulley right middle finger   SURGEON: [facility] Murray, M.D.   ASSISTANT: none   ANESTHESIA:  General and Local   INTRAVENOUS FLUIDS:  Per anesthesia flow sheet.   ESTIMATED BLOOD LOSS:  Minimal.   COMPLICATIONS:  None.   SPECIMENS:  none   TOURNIQUET TIME:    Total Tourniquet Time Documented: Forearm (Left) - 11 minutes Total: Forearm (Left) - 11 minutes    DISPOSITION:  Stable to PACU.   INDICATIONS:Patient is 75 year old male with a history of triggering digit.  He has recently began triggering of his left middle finger is elected to have this surgically released.  This is not responded to injections.  Pre-.  Postoperative course been discussed along with risks and complications.  He is aware that there is no guarantee to the surgery the possibility of infection recurrence injury to arteries nerves tendons complete relief symptoms dystrophy.  Preoperative area the patient seen extremity marked by both patient and surgeon antibiotic given  OPERATIVE COURSE: Patient is to the operating room placed in a supine position with left arm free.  IVs in the left side had failed.  General anesthetic was then carried out under the direction of the anesthesia department.  Prep was done with ChloraPrep 3-minute dry time allowed timeout taken confirm patient procedure.  The limb was exsanguinated with an Esmarch bandage tourniquet placed on the forearm was inflated to 250 mmHg.  An oblique incision was made over the A1 pulley of the left middle finger carried down through subcutaneous tissue.  Retractors were placed protecting the neurovascular bundle  radially and ulnarly.  The A1 pulley was found to be thickened this was released on its radial aspect.  A small incision was made centrally and A2.  The tenosynovium tissue proximally was separated with blunt dissection 2.  The 2 tendons were then placed in retractors and separated breaking  any adhesions between the 2 tendons.  The finger was placed through a full range of motion no further triggering was noted.  The wound was irrigated with saline and closed interrupted 4-0 nylon sutures.  Local infiltration quarter percent bupivacaine without epinephrine approximately 6 cc was used.  Sterile compressive dressing with the fingers free was applied.  Deflation of the tourniquet all fingers immediately pink.  He was taken to the recovery room for observation in satisfactory condition.  He will be discharged home to return to the hand center of [facility] in 1 week on Tylenol with ibuprofen for pain and Ultram for breakthrough.   Erik Brod, MD Electronically signed, 03/29/21

## 2021-03-29 NOTE — Discharge Instructions (Addendum)
Hand Center Instructions Hand Surgery  Wound Care: Keep your hand elevated above the level of your heart.  Do not allow it to dangle by your side.  Keep the dressing dry and do not remove it unless your doctor advises you to do so.  He will usually change it at the time of your post-op visit.  Moving your fingers is advised to stimulate circulation but will depend on the site of your surgery.  If you have a splint applied, your doctor will advise you regarding movement.  Activity: Do not drive or operate machinery today.  Rest today and then you may return to your normal activity and work as indicated by your physician.  Diet:  Drink liquids today or eat a light diet.  You may resume a regular diet tomorrow.    General expectations: Pain for two to three days. Fingers may become slightly swollen.  Call your doctor if any of the following occur: Severe pain not relieved by pain medication. Elevated temperature. Dressing soaked with blood. Inability to move fingers. Post Anesthesia Home Care Instructions  Activity: Get plenty of rest for the remainder of the day. A responsible individual must stay with you for 24 hours following the procedure.  For the next 24 hours, DO NOT: -Drive a car -Paediatric nurse -Drink alcoholic beverages -Take any medication unless instructed by your physician -Make any legal decisions or sign important papers.  Meals: Start with liquid foods such as gelatin or soup. Progress to regular foods as tolerated. Avoid greasy, spicy, heavy foods. If nausea and/or vomiting occur, drink only clear liquids until the nausea and/or vomiting subsides. Call your physician if vomiting continues.  Special Instructions/Symptoms: Your throat may feel dry or sore from the anesthesia or the breathing tube placed in your throat during surgery. If this causes discomfort, gargle with warm salt water. The discomfort should disappear within 24 hours.  If you had a scopolamine  patch placed behind your ear for the management of post- operative nausea and/or vomiting:  1. The medication in the patch is effective for 72 hours, after which it should be removed.  Wrap patch in a tissue and discard in the trash. Wash hands thoroughly with soap and water. 2. You may remove the patch earlier than 72 hours if you experience unpleasant side effects which may include dry mouth, dizziness or visual disturbances. 3. Avoid touching the patch. Wash your hands with soap and water after contact with the patch.       White or bluish color to fingers.

## 2021-03-30 NOTE — Anesthesia Postprocedure Evaluation (Signed)
Anesthesia Post Note  Patient: Erik Murray  Procedure(s) Performed: RELEASE TRIGGER FINGER/A-1 PULLEY LEFT MIDDLE FINGER (Left: Hand)     Patient location during evaluation: PACU Anesthesia Type: General Level of consciousness: awake and alert Pain management: pain level controlled Vital Signs Assessment: post-procedure vital signs reviewed and stable Respiratory status: spontaneous breathing, nonlabored ventilation and respiratory function stable Cardiovascular status: stable and blood pressure returned to baseline Anesthetic complications: no   No notable events documented.  Last Vitals:  Vitals:   03/29/21 1330 03/29/21 1345  BP: 132/79 134/80  Pulse: (!) 55 (!) 53  Resp: (!) 9 11  Temp:  (!) 36.3 C  SpO2: 99% 95%    Last Pain:  Vitals:   03/29/21 1345  TempSrc:   PainSc: 0-No pain                 Audry Pili

## 2021-03-31 ENCOUNTER — Encounter (HOSPITAL_BASED_OUTPATIENT_CLINIC_OR_DEPARTMENT_OTHER): Payer: Self-pay | Admitting: Orthopedic Surgery

## 2021-04-20 DIAGNOSIS — R972 Elevated prostate specific antigen [PSA]: Secondary | ICD-10-CM | POA: Diagnosis not present

## 2021-04-27 DIAGNOSIS — C61 Malignant neoplasm of prostate: Secondary | ICD-10-CM | POA: Diagnosis not present

## 2021-07-24 ENCOUNTER — Emergency Department (HOSPITAL_COMMUNITY): Payer: Medicare PPO

## 2021-07-24 ENCOUNTER — Ambulatory Visit
Admission: EM | Admit: 2021-07-24 | Discharge: 2021-07-24 | Payer: Medicare PPO | Attending: Urgent Care | Admitting: Urgent Care

## 2021-07-24 ENCOUNTER — Encounter (HOSPITAL_COMMUNITY): Payer: Self-pay | Admitting: Emergency Medicine

## 2021-07-24 ENCOUNTER — Other Ambulatory Visit: Payer: Self-pay

## 2021-07-24 ENCOUNTER — Encounter: Payer: Self-pay | Admitting: Emergency Medicine

## 2021-07-24 ENCOUNTER — Observation Stay (HOSPITAL_COMMUNITY)
Admission: EM | Admit: 2021-07-24 | Discharge: 2021-07-25 | Disposition: A | Discharge status: Home / Self Care | Payer: Medicare PPO | Attending: Internal Medicine | Admitting: Internal Medicine

## 2021-07-24 DIAGNOSIS — R001 Bradycardia, unspecified: Secondary | ICD-10-CM | POA: Diagnosis not present

## 2021-07-24 DIAGNOSIS — R600 Localized edema: Secondary | ICD-10-CM | POA: Diagnosis not present

## 2021-07-24 DIAGNOSIS — R0689 Other abnormalities of breathing: Secondary | ICD-10-CM | POA: Diagnosis not present

## 2021-07-24 DIAGNOSIS — Z87891 Personal history of nicotine dependence: Secondary | ICD-10-CM | POA: Insufficient documentation

## 2021-07-24 DIAGNOSIS — I491 Atrial premature depolarization: Secondary | ICD-10-CM | POA: Diagnosis not present

## 2021-07-24 DIAGNOSIS — Z8546 Personal history of malignant neoplasm of prostate: Secondary | ICD-10-CM | POA: Diagnosis not present

## 2021-07-24 DIAGNOSIS — S61012A Laceration without foreign body of left thumb without damage to nail, initial encounter: Secondary | ICD-10-CM | POA: Diagnosis not present

## 2021-07-24 DIAGNOSIS — M79645 Pain in left finger(s): Secondary | ICD-10-CM | POA: Diagnosis not present

## 2021-07-24 DIAGNOSIS — I493 Ventricular premature depolarization: Secondary | ICD-10-CM | POA: Diagnosis not present

## 2021-07-24 DIAGNOSIS — R9431 Abnormal electrocardiogram [ECG] [EKG]: Secondary | ICD-10-CM | POA: Diagnosis not present

## 2021-07-24 LAB — CBC WITH DIFFERENTIAL/PLATELET
Abs Immature Granulocytes: 0.01 10*3/uL (ref 0.00–0.07)
Basophils Absolute: 0.1 10*3/uL (ref 0.0–0.1)
Basophils Relative: 1 %
Eosinophils Absolute: 0.3 10*3/uL (ref 0.0–0.5)
Eosinophils Relative: 3 %
HCT: 49 % (ref 39.0–52.0)
Hemoglobin: 16.3 g/dL (ref 13.0–17.0)
Immature Granulocytes: 0 %
Lymphocytes Relative: 32 %
Lymphs Abs: 3 10*3/uL (ref 0.7–4.0)
MCH: 32 pg (ref 26.0–34.0)
MCHC: 33.3 g/dL (ref 30.0–36.0)
MCV: 96.3 fL (ref 80.0–100.0)
Monocytes Absolute: 0.8 10*3/uL (ref 0.1–1.0)
Monocytes Relative: 9 %
Neutro Abs: 5.1 10*3/uL (ref 1.7–7.7)
Neutrophils Relative %: 55 %
Platelets: 145 10*3/uL — ABNORMAL LOW (ref 150–400)
RBC: 5.09 MIL/uL (ref 4.22–5.81)
RDW: 13.5 % (ref 11.5–15.5)
WBC: 9.4 10*3/uL (ref 4.0–10.5)
nRBC: 0 % (ref 0.0–0.2)

## 2021-07-24 LAB — BASIC METABOLIC PANEL
Anion gap: 8 (ref 5–15)
BUN: 16 mg/dL (ref 8–23)
CO2: 23 mmol/L (ref 22–32)
Calcium: 8.8 mg/dL — ABNORMAL LOW (ref 8.9–10.3)
Chloride: 108 mmol/L (ref 98–111)
Creatinine, Ser: 1 mg/dL (ref 0.61–1.24)
GFR, Estimated: >60 mL/min (ref >60)
Glucose, Bld: 94 mg/dL (ref 70–99)
Potassium: 4.1 mmol/L (ref 3.5–5.1)
Sodium: 139 mmol/L (ref 135–145)

## 2021-07-24 LAB — TROPONIN I (HIGH SENSITIVITY)
Troponin I (High Sensitivity): 9 ng/L (ref <18)
Troponin I (High Sensitivity): 11 ng/L (ref <18)

## 2021-07-24 LAB — BRAIN NATRIURETIC PEPTIDE: B Natriuretic Peptide: 203.8 pg/mL — ABNORMAL HIGH (ref 0.0–100.0)

## 2021-07-24 LAB — T4, FREE: Free T4: 1.02 ng/dL (ref 0.61–1.12)

## 2021-07-24 LAB — MAGNESIUM: Magnesium: 2.1 mg/dL (ref 1.7–2.4)

## 2021-07-24 LAB — TSH: TSH: 1.708 u[IU]/mL (ref 0.350–4.500)

## 2021-07-24 NOTE — ED Triage Notes (Signed)
Patient states he cut his left thumb today with a dirty knife.  Patient unsure of last Tdap.

## 2021-07-24 NOTE — Assessment & Plan Note (Signed)
Sent to medical telemetry bed.  Observation status.  Check echocardiogram.  We will hold off on starting beta-blockade at this time in order for the echo to be evaluated while he has his normal trigeminy.  Given his exercise tolerance, unlikely he has obstructive coronary disease.  We will check a TSH and T4.
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
## 2021-07-24 NOTE — ED Notes (Signed)
Patient is being discharged from the Urgent Care and sent to the Emergency Department via [facility] . Per PA, patient is in need of higher level of care due to low heart rate of 32. Patient is aware and verbalizes understanding of plan of care.  Vitals:   07/24/21 1626 07/24/21 1636  BP: 140/68   Pulse: (!) 32   Resp:  18  SpO2:

## 2021-07-24 NOTE — ED Notes (Signed)
Pt given sandwich bag and water per request. No acute changes noted. Will continue to monitor. Pt denies any complaints.

## 2021-07-24 NOTE — ED Provider Notes (Signed)
[place] EMERGENCY DEPARTMENT Provider Note  CSN: [id number] Arrival date & time: 07/24/21 1719    History Chief Complaint  Patient presents with   Bradycardia    Erik TRAWEEK is a 75 y.o. male with no significant PMH accidentally cut his thumb today. While at [facility] getting repaired he was noted to be bradycardic. He had an EKG showing frequent PVCs and was sent to the ED for evaluation. He is not having any symptoms including CP, SOB, weakness, palpitations, difficulty with activities. No fevers, N/V/D, poor appetite etc. No prior history of heart problems. He reports his smart watch had alerted him several times in recent weeks of low HR but each time he checked it, he was getting normal rates so he didn't seek care.    Past Medical History:  Diagnosis Date   Arthritis    Cancer ([place]) 2016   prostate   Hx of adenomatous colonic polyps     Past Surgical History:  Procedure Laterality Date   COLONOSCOPY  02/05/2012   IFO:[id number] and rectal polyps/few diverticulosis (adenomas). next TCS 01/2015   COLONOSCOPY N/A 02/10/2015   Procedure: COLONOSCOPY;  Surgeon: Daneil Dolin, MD;  [facility];  Service: Endoscopy;  Laterality: N/A;   COLONOSCOPY     COLONOSCOPY N/A 05/15/2018   Procedure: COLONOSCOPY;  Surgeon: Daneil Dolin, MD;  [facility];  Service: Endoscopy;  Laterality: N/A;  7:30   EYE SURGERY Bilateral apirl, may 2013   cataract lens replacement   LYMPHADENECTOMY Bilateral 10/01/2014   Procedure: LYMPHADENECTOMY;  Surgeon: Raynelle Bring, MD;  [facility];  Service: Urology;  Laterality: Bilateral;   POLYPECTOMY  05/15/2018   Procedure: POLYPECTOMY;  Surgeon: Daneil Dolin, MD;  [facility];  Service: Endoscopy;;  recto-sigmoid colon x2; splenic flexure(cs);   PROSTATE BIOPSY  dec 2015   ROBOT ASSISTED LAPAROSCOPIC RADICAL PROSTATECTOMY N/A 10/01/2014   Procedure: ROBOTIC ASSISTED LAPAROSCOPIC RADICAL PROSTATECTOMY LEVEL 2;   Surgeon: Raynelle Bring, MD;  [facility];  Service: Urology;  Laterality: N/A;   TONSILLECTOMY  as child   TRIGGER FINGER RELEASE  10/10/2011   Procedure: RELEASE TRIGGER FINGER/A-1 PULLEY;  Surgeon: Wynonia Sours, MD;  Location: [place];  Service: Orthopedics;  Laterality: Right;  right thumb   TRIGGER FINGER RELEASE Right 05/23/2016   Procedure: RELEASE TRIGGER FINGER/A-1 PULLEY right index finger;  Surgeon: Daryll Brod, MD;  Location: [place];  Service: Orthopedics;  Laterality: Right;  FAB   TRIGGER FINGER RELEASE Left 07/18/2016   Procedure: RELEASE TRIGGER FINGER/A-1 PULLEY, left index;  Surgeon: Daryll Brod, MD;  Location: [place];  Service: Orthopedics;  Laterality: Left;  FAB   TRIGGER FINGER RELEASE Left 03/29/2021   Procedure: RELEASE TRIGGER FINGER/A-1 PULLEY LEFT MIDDLE FINGER;  Surgeon: Daryll Brod, MD;  Location: [place];  Service: Orthopedics;  Laterality: Left;   YAG LASER APPLICATION Right 7/86/7672   Procedure: YAG LASER APPLICATION;  Surgeon: Rutherford Guys, MD;  [facility];  Service: Ophthalmology;  Laterality: Right;   YAG LASER APPLICATION Left 0/94/7096   Procedure: YAG LASER APPLICATION;  Surgeon: Rutherford Guys, MD;  [facility];  Service: Ophthalmology;  Laterality: Left;    Family History  Problem Relation Age of Onset   Diabetes Mother    Heart disease Mother    Other Father        age 41, deceased form of lupus   Colon cancer  Neg Hx     Social History   Tobacco Use   Smoking status: Former    Packs/day: 1.00    Years: 40.00    Pack years: 40.00    Types: Cigarettes   Smokeless tobacco: Former    Quit date: 10/03/2005  Vaping Use   Vaping Use: Never used  Substance Use Topics   Alcohol use: Yes    Alcohol/week: 6.0 standard drinks    Types: 6 Standard drinks or equivalent per week    Comment: occasional   Drug use: No     Home Medications Prior to Admission  medications   Medication Sig Start Date End Date Taking? Authorizing Provider  multivitamin (ONE-A-DAY MEN'S) TABS tablet Take 1 tablet by mouth daily.   Yes [provider]  traMADol (ULTRAM) 50 MG tablet Take 1 tablet (50 mg total) by mouth every 6 (six) hours as needed. Patient not taking: Reported on 07/24/2021 03/29/21   Daryll Brod, MD     Allergies    Patient has no known allergies.   Review of Systems   Review of Systems A comprehensive review of systems was completed and negative except as noted in HPI.    Physical Exam BP 138/87   Pulse (!) 33   Temp 99 F (37.2 C) (Oral)   Resp (!) 21   Ht 5\' 10"  (1.778 m)   Wt 94.3 kg   SpO2 96%   BMI 29.84 kg/m   Physical Exam Vitals and nursing note reviewed.  Constitutional:      Appearance: Normal appearance.  HENT:     Head: Normocephalic and atraumatic.     Nose: Nose normal.     Mouth/Throat:     Mouth: Mucous membranes are moist.  Eyes:     Extraocular Movements: Extraocular movements intact.     Conjunctiva/sclera: Conjunctivae normal.  Cardiovascular:     Rate and Rhythm: Normal rate. Rhythm irregular.  Pulmonary:     Effort: Pulmonary effort is normal.     Breath sounds: Normal breath sounds.  Abdominal:     General: Abdomen is flat.     Palpations: Abdomen is soft.     Tenderness: There is no abdominal tenderness.  Musculoskeletal:        General: No swelling. Normal range of motion.     Cervical back: Neck supple.  Skin:    General: Skin is warm and dry.  Neurological:     General: No focal deficit present.     Mental Status: He is alert.  Psychiatric:        Mood and Affect: Mood normal.     ED Results / Procedures / Treatments   Labs (all labs ordered are listed, but only abnormal results are displayed) Labs Reviewed  BASIC METABOLIC PANEL - Abnormal; Notable for the following components:      Result Value   Calcium 8.8 (*)    All other components within normal limits  CBC WITH  DIFFERENTIAL/PLATELET - Abnormal; Notable for the following components:   Platelets 145 (*)    All other components within normal limits  BRAIN NATRIURETIC PEPTIDE - Abnormal; Notable for the following components:   B Natriuretic Peptide 203.8 (*)    All other components within normal limits  MAGNESIUM  TSH  TROPONIN I (HIGH SENSITIVITY)  TROPONIN I (HIGH SENSITIVITY)    EKG EKG Interpretation  Date/Time:  Sunday July 24 2021 17:30:43 EST Ventricular Rate:  114 PR Interval:    QRS Duration: 102  QT Interval:  385 QTC Calculation: 348 R Axis:   72 Text Interpretation: Sinus rhythm Frequent and consecutive PVCs No significant change since last tracing  Confirmed by Calvert Cantor [phone]) on 07/24/2021 5:33:04 PM  Radiology DG Chest Port 1 View  Result Date: 07/24/2021 CLINICAL DATA:  Pt states he cut his his thumb on a dirty knife. Reason for exam " bradycardia"bradycardia EXAM: PORTABLE CHEST 1 VIEW COMPARISON:  None. FINDINGS: The heart size and mediastinal contours are within normal limits. Both lungs are clear. The visualized skeletal structures are unremarkable. IMPRESSION: No acute cardiopulmonary process. Electronically Signed   By: Suzy Bouchard M.D.   On: 07/24/2021 18:37    Procedures Procedures  Medications Ordered in the ED Medications - No data to display   MDM Rules/Calculators/A&P MDM Patient with frequent PVCs and nonsustained v-tach on monitor. Will discuss with cardiology, currently patient is asymptomatic.   ED Course  I have reviewed the triage vital signs and the nursing notes.  Pertinent labs & imaging results that were available during my care of the patient were reviewed by me and considered in my medical decision making (see chart for details).  Clinical Course as of 07/24/21 1951  Sun Jul 24, 2021  1801 Spoke with Dr. Clayton Bibles, Cardiology who has reviewed EKG and recommend obs admission for echo in the AM. Agrees with lab evaluation and  continue to monitor for now.  [CS]  6568 CBC is normal.  [CS]  1275 CXR is clear [CS]  1913 BMP, magnesium and Trop are normal. BNP only mildly elevated.  [CS]  1921 Patient continues to have frequent and consecutive PVCs but he is also still asymptomatic. Will admit for obs to get echocardiogram tomorrow. Patient is amenable to this plan. Hospitalist paged . [CS]  1946 Spoke with Dr. Bridgett Larsson, Hospitalist who will evaluate for admission.  [CS]    Clinical Course User Index [CS] Truddie Hidden, MD    Final Clinical Impression(s) / ED Diagnoses Final diagnoses:  PVC's (premature ventricular contractions)    Rx / DC Orders ED Discharge Orders     None        Truddie Hidden, MD 07/25/21 [phone]

## 2021-07-24 NOTE — ACP (Advance Care Planning) (Signed)
  Advance Care Planning  Reason for Advance Care Planning Conversation: Acute hospitalization Principal Problem:   PVC (premature ventricular contraction)    I discussed with patient about advance care planning. Specifically, we discussed whether patient would desire cardiopulmonary resuscitation (CPR) in the event of acute cardiopulmonary arrest. We also discussed whether endotracheal intubation and temporary ventilator life support would be desired in the event of acute cardio- or pulmonary decompensation.   Code status order of Full Code has been entered in accord with the patient's wishes. with intubation  Living will: no  Health Care Agent / Davonna Belling              Name: lynn Makela: Relationship to Patient: wife   Is agent appointed in legal document no  Time spent today in ACP discussion was  5 mins  Kristopher Oppenheim, DO [facility]

## 2021-07-24 NOTE — H&P (Signed)
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  Patient was seen in urgent care today after he cut his thumb with a rusty knife while working out in his backyard.  In the urgent care, he was noted to have multiple PVCs on EKG.  He was sent to the ER for evaluation.  Patient states that he has been wearing his apple watch for several years now.  Over the last 2 months, his apple watch has been frequently telling him his heart rate is less than 40 for greater than 10 minutes.  This happens at least once a week sometimes several times a week.  At no point has he ever had any chest pain, shortness of breath, dyspnea, lightheadedness.  Patient's exercise tolerance is quite good.  Patient walks his dog twice a day.  His evening walk consists of a least a mile to 2 miles.  He then gets on his recumbent bicycle and exercises for at least 30 minutes.  He gets his heart rate between 100-120 beats per minute while he is biking.  At no point has he ever had any chest pain, shortness of breath, nausea, vomiting while exercising.  On the ER, EKG demonstrated frequent trigeminy.  Patient was asymptomatic.  Denies any palpitations.  Patient without any prior history of coronary disease.  He has had a history of prostate cancer status post TURP.  He only takes a multivitamin daily.  Due to his frequent PVCs, [facility] hospital[facility] contacted for admission.   ED Course: EKG with multiple and frequent PVC, bigeminy and trigeminy  Review of Systems:  Review of Systems  Constitutional: Negative.   HENT: Negative.    Eyes: Negative.   Respiratory: Negative.  Negative for cough, hemoptysis, sputum production,  shortness of breath and wheezing.   Cardiovascular: Negative.  Negative for chest pain, palpitations, orthopnea, claudication and leg swelling.  Gastrointestinal: Negative.   Genitourinary: Negative.   Musculoskeletal: Negative.        Cut left thumb today  Skin: Negative.   Neurological: Negative.   Endo/Heme/Allergies: Negative.   Psychiatric/Behavioral: Negative.    All other systems reviewed and are negative.  Past Medical History:  Diagnosis Date   Arthritis    Cancer ([place]) 2016   prostate   Hx of adenomatous colonic polyps     Past Surgical History:  Procedure Laterality Date   COLONOSCOPY  02/05/2012   GYI:[id number] and rectal polyps/few diverticulosis (adenomas). next TCS 01/2015   COLONOSCOPY N/A 02/10/2015   Procedure: COLONOSCOPY;  Surgeon: Daneil Dolin, MD;  [facility];  Service: Endoscopy;  Laterality: N/A;   COLONOSCOPY     COLONOSCOPY N/A 05/15/2018   Procedure: COLONOSCOPY;  Surgeon: Daneil Dolin, MD;  [facility];  Service: Endoscopy;  Laterality: N/A;  7:30   EYE SURGERY Bilateral apirl, may 2013   cataract lens replacement   LYMPHADENECTOMY Bilateral 10/01/2014   Procedure: LYMPHADENECTOMY;  Surgeon: Raynelle Bring, MD;  [facility];  Service: Urology;  Laterality: Bilateral;   POLYPECTOMY  05/15/2018   Procedure: POLYPECTOMY;  Surgeon: Daneil Dolin, MD;  [facility];  Service: Endoscopy;;  recto-sigmoid colon x2; splenic flexure(cs);   PROSTATE  BIOPSY  dec 2015   ROBOT ASSISTED LAPAROSCOPIC RADICAL PROSTATECTOMY N/A 10/01/2014   Procedure: ROBOTIC ASSISTED LAPAROSCOPIC RADICAL PROSTATECTOMY LEVEL 2;  Surgeon: Raynelle Bring, MD;  [facility];  Service: Urology;  Laterality: N/A;   TONSILLECTOMY  as child   TRIGGER FINGER RELEASE  10/10/2011   Procedure: RELEASE TRIGGER FINGER/A-1 PULLEY;  Surgeon: Wynonia Sours, MD;  Location: [place];  Service: Orthopedics;  Laterality: Right;  right thumb    TRIGGER FINGER RELEASE Right 05/23/2016   Procedure: RELEASE TRIGGER FINGER/A-1 PULLEY right index finger;  Surgeon: Daryll Brod, MD;  Location: [place];  Service: Orthopedics;  Laterality: Right;  FAB   TRIGGER FINGER RELEASE Left 07/18/2016   Procedure: RELEASE TRIGGER FINGER/A-1 PULLEY, left index;  Surgeon: Daryll Brod, MD;  Location: [place];  Service: Orthopedics;  Laterality: Left;  FAB   TRIGGER FINGER RELEASE Left 03/29/2021   Procedure: RELEASE TRIGGER FINGER/A-1 PULLEY LEFT MIDDLE FINGER;  Surgeon: Daryll Brod, MD;  Location: [place];  Service: Orthopedics;  Laterality: Left;   YAG LASER APPLICATION Right 4/33/2951   Procedure: YAG LASER APPLICATION;  Surgeon: Rutherford Guys, MD;  [facility];  Service: Ophthalmology;  Laterality: Right;   YAG LASER APPLICATION Left 8/84/1660   Procedure: YAG LASER APPLICATION;  Surgeon: Rutherford Guys, MD;  [facility];  Service: Ophthalmology;  Laterality: Left;     reports that he has quit smoking. His smoking use included cigarettes. He has a 40.00 pack-year smoking history. He quit smokeless tobacco use about 15 years ago. He reports current alcohol use of about 6.0 standard drinks per week. He reports that he does not use drugs.  No Known Allergies  Family History  Problem Relation Age of Onset   Diabetes Mother    Heart disease Mother    Other Father        age 18, deceased form of lupus   Colon cancer Neg Hx     Prior to Admission medications   Medication Sig Start Date End Date Taking? Authorizing Provider  multivitamin (ONE-A-DAY MEN'S) TABS tablet Take 1 tablet by mouth daily.   Yes [provider]  traMADol (ULTRAM) 50 MG tablet Take 1 tablet (50 mg total) by mouth every 6 (six) hours as needed. Patient not taking: Reported on 07/24/2021 03/29/21   Daryll Brod, MD    Physical Exam: Vitals:   07/24/21 1815 07/24/21 1845 07/24/21 1900 07/24/21 1930  BP: (!) 149/69  (!) 162/80 (!) 172/157 138/87  Pulse: (!) 26 (!) 29 (!) 57 (!) 33  Resp: 20 20 18  (!) 21  Temp:      TempSrc:      SpO2: 95% 97% 96% 96%  Weight:      Height:        Physical Exam Vitals and nursing note reviewed.  Constitutional:      General: He is not in acute distress.    Appearance: Normal appearance. He is normal weight. He is not ill-appearing, toxic-appearing or diaphoretic.  HENT:     Head: Normocephalic and atraumatic.     Nose: Nose normal. No rhinorrhea.  Eyes:     General:        Right eye: No discharge.        Left eye: No discharge.     Pupils: Pupils are equal, round, and reactive to light.  Cardiovascular:     Rate and Rhythm: Normal rate and regular rhythm. FrequentExtrasystoles are present.  Pulses: Normal pulses.  Pulmonary:     Effort: Pulmonary effort is normal. No respiratory distress.     Breath sounds: Normal breath sounds. No wheezing or rales.  Abdominal:     General: Abdomen is flat. Bowel sounds are normal. There is no distension.     Palpations: Abdomen is soft.     Tenderness: There is no abdominal tenderness. There is no guarding or rebound.  Musculoskeletal:     Right lower leg: No edema.     Left lower leg: No edema.  Skin:    General: Skin is warm and dry.     Capillary Refill: Capillary refill takes less than 2 seconds.  Neurological:     General: No focal deficit present.     Mental Status: He is alert and oriented to person, place, and time.     Labs on Admission: I have personally reviewed following labs and imaging studies  CBC: Recent Labs  Lab 07/24/21 1748  WBC 9.4  NEUTROABS 5.1  HGB 16.3  HCT 49.0  MCV 96.3  PLT 606*   Basic Metabolic Panel: Recent Labs  Lab 07/24/21 1748  NA 139  K 4.1  CL 108  CO2 23  GLUCOSE 94  BUN 16  CREATININE 1.00  CALCIUM 8.8*  MG 2.1   GFR: Estimated Creatinine Clearance: 74.7 mL/min (by C-G formula based on SCr of 1 mg/dL). Liver Function Tests: No results for input(s):  AST, ALT, ALKPHOS, BILITOT, PROT, ALBUMIN in the last 168 hours. No results for input(s): LIPASE, AMYLASE in the last 168 hours. No results for input(s): AMMONIA in the last 168 hours. Coagulation Profile: No results for input(s): INR, PROTIME in the last 168 hours. Cardiac Enzymes: No results for input(s): CKTOTAL, CKMB, CKMBINDEX, TROPONINI in the last 168 hours. BNP (last 3 results) No results for input(s): PROBNP in the last 8760 hours. HbA1C: No results for input(s): HGBA1C in the last 72 hours. CBG: No results for input(s): GLUCAP in the last 168 hours. Lipid Profile: No results for input(s): CHOL, HDL, LDLCALC, TRIG, CHOLHDL, LDLDIRECT in the last 72 hours. Thyroid Function Tests: No results for input(s): TSH, T4TOTAL, FREET4, T3FREE, THYROIDAB in the last 72 hours. Anemia Panel: No results for input(s): VITAMINB12, FOLATE, FERRITIN, TIBC, IRON, RETICCTPCT in the last 72 hours. Urine analysis: No results found for: COLORURINE, APPEARANCEUR, LABSPEC, [place], GLUCOSEU, HGBUR, BILIRUBINUR, KETONESUR, PROTEINUR, UROBILINOGEN, NITRITE, LEUKOCYTESUR  Radiological Exams on Admission: I have personally reviewed images DG Chest Port 1 View  Result Date: 07/24/2021 CLINICAL DATA:  Pt states he cut his his thumb on a dirty knife. Reason for exam " bradycardia"bradycardia EXAM: PORTABLE CHEST 1 VIEW COMPARISON:  None. FINDINGS: The heart size and mediastinal contours are within normal limits. Both lungs are clear. The visualized skeletal structures are unremarkable. IMPRESSION: No acute cardiopulmonary process. Electronically Signed   By: Suzy Bouchard M.D.   On: 07/24/2021 18:37    EKG: I have personally reviewed EKG: trigeminy    Assessment/Plan Principal Problem:   PVC (premature ventricular contraction)   PVC (premature ventricular contraction) Sent to medical telemetry bed.  Observation status.  Check echocardiogram.  We will hold off on starting beta-blockade at this time in  order for the echo to be evaluated while he has his normal trigeminy.  Given his exercise tolerance, unlikely he has obstructive coronary disease.  We will check a TSH and T4.  DVT prophylaxis: SCDs Code Status: Full Code Family Communication: no family at bedside  Disposition Plan:  return to home  Consults called: none  Admission status: Observation, Telemetry bed   Kristopher Oppenheim, DO [facility] 07/24/2021, 8:20 PM

## 2021-07-24 NOTE — ED Triage Notes (Signed)
Pt BIB [facility] from [facility]. Pt cut his thumb and went to [facility] who saw that his HR was in the 20s-80s. EMS reports HR 40s. EKG showed SB with bigeminy and trigeminy. Denies CP, SOB.

## 2021-07-24 NOTE — ED Provider Notes (Addendum)
[place]   MRN: [id number] DOB: Oct 06, 1945  Subjective:   Erik Murray is a 75 y.o. male presenting for left thumb laceration.  He accidentally cut himself while using a dirty knife.  His Tdap was updated 2 years ago.  Denies loss of range of motion, loss of sensation.  No current facility-administered medications for this encounter.  Current Outpatient Medications:    multivitamin (ONE-A-DAY MEN'S) TABS tablet, Take 1 tablet by mouth daily., Disp: , Rfl:    traMADol (ULTRAM) 50 MG tablet, Take 1 tablet (50 mg total) by mouth every 6 (six) hours as needed., Disp: 20 tablet, Rfl: 0   No Known Allergies  Past Medical History:  Diagnosis Date   Arthritis    Cancer ([place]) 2016   prostate   Hx of adenomatous colonic polyps      Past Surgical History:  Procedure Laterality Date   COLONOSCOPY  02/05/2012   WUJ:WJXBJYN and rectal polyps/few diverticulosis (adenomas). next TCS 01/2015   COLONOSCOPY N/A 02/10/2015   Procedure: COLONOSCOPY;  Surgeon: Daneil Dolin, MD;  [facility];  Service: Endoscopy;  Laterality: N/A;   COLONOSCOPY     COLONOSCOPY N/A 05/15/2018   Procedure: COLONOSCOPY;  Surgeon: Daneil Dolin, MD;  [facility];  Service: Endoscopy;  Laterality: N/A;  7:30   EYE SURGERY Bilateral apirl, may 2013   cataract lens replacement   LYMPHADENECTOMY Bilateral 10/01/2014   Procedure: LYMPHADENECTOMY;  Surgeon: Raynelle Bring, MD;  Location: [facility];  Service: Urology;  Laterality: Bilateral;   POLYPECTOMY  05/15/2018   Procedure: POLYPECTOMY;  Surgeon: Daneil Dolin, MD;  [facility];  Service: Endoscopy;;  recto-sigmoid colon x2; splenic flexure(cs);   PROSTATE BIOPSY  dec 2015   ROBOT ASSISTED LAPAROSCOPIC RADICAL PROSTATECTOMY N/A 10/01/2014   Procedure: ROBOTIC ASSISTED LAPAROSCOPIC RADICAL PROSTATECTOMY LEVEL 2;  Surgeon: Raynelle Bring, MD;  Location: [facility];  Service: Urology;  Laterality: N/A;   TONSILLECTOMY   as child   TRIGGER FINGER RELEASE  10/10/2011   Procedure: RELEASE TRIGGER FINGER/A-1 PULLEY;  Surgeon: Wynonia Sours, MD;  Location: [place];  Service: Orthopedics;  Laterality: Right;  right thumb   TRIGGER FINGER RELEASE Right 05/23/2016   Procedure: RELEASE TRIGGER FINGER/A-1 PULLEY right index finger;  Surgeon: Daryll Brod, MD;  Location: [place];  Service: Orthopedics;  Laterality: Right;  FAB   TRIGGER FINGER RELEASE Left 07/18/2016   Procedure: RELEASE TRIGGER FINGER/A-1 PULLEY, left index;  Surgeon: Daryll Brod, MD;  Location: [place];  Service: Orthopedics;  Laterality: Left;  FAB   TRIGGER FINGER RELEASE Left 03/29/2021   Procedure: RELEASE TRIGGER FINGER/A-1 PULLEY LEFT MIDDLE FINGER;  Surgeon: Daryll Brod, MD;  Location: [place];  Service: Orthopedics;  Laterality: Left;   YAG LASER APPLICATION Right 04/11/5620   Procedure: YAG LASER APPLICATION;  Surgeon: Rutherford Guys, MD;  Location[facility];  Service: Ophthalmology;  Laterality: Right;   YAG LASER APPLICATION Left 10/19/6576   Procedure: YAG LASER APPLICATION;  Surgeon: Rutherford Guys, MD;  Location[facility];  Service: Ophthalmology;  Laterality: Left;    Family History  Problem Relation Age of Onset   Diabetes Mother    Heart disease Mother    Other Father        age 32, deceased form of lupus   Colon cancer Neg Hx     Social History   Tobacco Use   Smoking status: Former  Packs/day: 1.00    Years: 40.00    Pack years: 40.00    Types: Cigarettes   Smokeless tobacco: Former    Quit date: 10/03/2005  Vaping Use   Vaping Use: Never used  Substance Use Topics   Alcohol use: Yes    Comment: occasional   Drug use: No    ROS   Objective:   Vitals: BP 140/68 (BP Location: Right Arm)   Pulse (!) 32   SpO2 96%   During his visit here pulse fluctuated between 22 bpm to 80 bpm.  It primarily stayed between 20 and 30 bpm.  Physical  Exam Constitutional:      General: He is not in acute distress.    Appearance: Normal appearance. He is well-developed and normal weight. He is not ill-appearing, toxic-appearing or diaphoretic.  HENT:     Head: Normocephalic and atraumatic.     Right Ear: External ear normal.     Left Ear: External ear normal.     Nose: Nose normal.     Mouth/Throat:     Mouth: Mucous membranes are moist.     Pharynx: Oropharynx is clear.  Eyes:     General: No scleral icterus.       Right eye: No discharge.        Left eye: No discharge.     Extraocular Movements: Extraocular movements intact.     Pupils: Pupils are equal, round, and reactive to light.  Cardiovascular:     Rate and Rhythm: Bradycardia present. Rhythm irregular.     Heart sounds: Normal heart sounds. No murmur heard.   No friction rub. No gallop.  Pulmonary:     Effort: Pulmonary effort is normal. No respiratory distress.     Breath sounds: Normal breath sounds. No stridor. No wheezing, rhonchi or rales.  Musculoskeletal:       Hands:     Cervical back: Normal range of motion.  Neurological:     Mental Status: He is alert and oriented to person, place, and time.  Psychiatric:        Mood and Affect: Mood normal.        Behavior: Behavior normal.        Thought Content: Thought content normal.        Judgment: Judgment normal.   PROCEDURE NOTE: laceration repair Verbal consent obtained from patient.  Local anesthesia with 3cc Lidocaine 2% without epinephrine.  Wound explored for tendon, ligament damage. Wound scrubbed with soap and water and rinsed. Wound closed with #6 5-0 Ethilon (simple interrupted) sutures.  Wound cleansed and dressed.  ED ECG REPORT   Date: 07/24/2021  EKG Time: 4:27 PM  Rate: 87bpm  Rhythm:  Undetermined rhythm , there are no previous tracings available for comparison  Axis: Normal  Intervals: Undetermined  but suspect second versus third degree heart block.  ST&T Change: non-specific t-wave  abnormality  Narrative Interpretation: Suspect 2nd versus 3rd degree heart block, reading is undetermined rhythm at a ventricular rate of 87 bpm. No previous ecg available for comparison.    Assessment and Plan :   PDMP not reviewed this encounter.  1. Pain of left thumb   2. Thumb laceration, left, initial encounter    At triage, patient had bradycardia.  Radial pulse bradycardic at ~30bpm, auscultated pulse at ~28bpm. No previous ecg for comparison. Undetermined rhythm on ecg but suspicion is for second versus third degree heartblock. Patient is completely asymptomatic.  No chest pain, shortness of breath, palpitations,  confusion, headache, dizziness.  However,he is in need of a higher level of care than we can provide in the urgent care setting. Patient to be transported to [facility] for further evaluation and intervention. Case reported out to EMS.   Otherwise, laceration repaired successfully. Wound care reviewed. Recommended Tylenol and/or ibuprofen for pain control. Return-to-clinic precautions discussed, patient verbalized understanding. Otherwise, follow up in 10 days for suture removal. Counseled patient on potential for adverse effects with medications prescribed/recommended today.    Jaynee Eagles, PA-C 07/24/21 0175

## 2021-07-24 NOTE — Discharge Instructions (Signed)

## 2021-07-24 NOTE — ED Notes (Addendum)
Pt resting on stretcher, moved to room 46. Pt denies any complaints at present. Lights off, side rails up x2, urinal at bedside, call bell within reach. No acute changes noted. Report given to Lanny Hurst, RN.

## 2021-07-24 NOTE — ED Notes (Signed)
Report given to [facility]

## 2021-07-25 ENCOUNTER — Other Ambulatory Visit: Payer: Self-pay | Admitting: Physician Assistant

## 2021-07-25 ENCOUNTER — Observation Stay (HOSPITAL_BASED_OUTPATIENT_CLINIC_OR_DEPARTMENT_OTHER): Payer: Medicare PPO

## 2021-07-25 ENCOUNTER — Observation Stay (HOSPITAL_COMMUNITY)
Admit: 2021-07-25 | Discharge: 2021-07-25 | Disposition: A | Discharge status: Home / Self Care | Payer: Medicare PPO | Attending: Physician Assistant | Admitting: Physician Assistant

## 2021-07-25 DIAGNOSIS — I493 Ventricular premature depolarization: Secondary | ICD-10-CM

## 2021-07-25 DIAGNOSIS — R9431 Abnormal electrocardiogram [ECG] [EKG]: Secondary | ICD-10-CM

## 2021-07-25 LAB — ECHOCARDIOGRAM COMPLETE
Area-P 1/2: 3.83 cm2
Height: 70 in
S' Lateral: 4.1 cm
Weight: 3328 oz

## 2021-07-25 MED ORDER — METOPROLOL TARTRATE 25 MG PO TABS: 25.0000 mg | ORAL_TABLET | Freq: Two times a day (BID) | ORAL | 3 refills | Status: DC

## 2021-07-25 NOTE — Discharge Summary (Signed)
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  Patient was seen in urgent care today after he cut his thumb with a rusty knife while working out in his backyard.  In the urgent care, he was noted to have multiple PVCs on EKG.  He was sent to the ER for evaluation. Patient stated that he has been wearing his apple watch for several years now.  Over the last 2 months, his apple watch has been frequently telling him his heart rate is less than 40 for greater than 10 minutes.  This happens at least once a week sometimes several times a week.  At no point has he ever had any chest pain, shortness of breath, dyspnea, lightheadedness. Patient's exercise tolerance is quite good.  Patient walks his dog twice a day.  His evening walk consists of a least a mile to 2 miles.  He then gets on his recumbent bicycle and exercises for at least 30 minutes.  He gets his heart rate between 100-120 beats per minute while he is biking.  At no point has he ever had any chest pain, shortness of breath, nausea, vomiting while exercising.   In the ER, EKG demonstrated frequent trigeminy.  Patient was asymptomatic.  Denies any palpitations.  Patient without any prior history of coronary disease.  He has had a history of prostate cancer status post TURP. He only takes a multivitamin daily. Due to his frequent PVCs, [facility] hospitalist contacted for admission Monitor  overnight continue to have PVCs, echocardiogram was done that was unremarkable, cardio input requested prior to discharge home.  Discussed cardiology there have prescribe metoprolol and cardiac monitor and is stable for discharge with close outpatient follow-up  Discharge Diagnoses:   Frequent PVCs: Appears asymptomatic but EKG and telemetry with constant frequent PVCs TSH stable, echocardiogram with normal EF no acute finding.  Cardio was consulted-placing a heart monitor and also low-dose metoprolol and will need very close follow-up and outpatient work-up possible EP eval.  EP was curbsided by cardiology this admission.   History of prostate cancer history of postop  Consults: cardiology  Subjective: Alert awake oriented resting comfortably awaiting for cardiology input Discharge Exam: Vitals:   07/25/21 1600 07/25/21 1702  BP: (!) 144/97 (!) 159/66  Pulse: 74 65  Resp: (!) 21 16  Temp:  98.9 F (37.2 C)  SpO2: 94% 96%   General: Pt is alert, awake, not in acute distress Cardiovascular: RRR, S1/S2 +, no rubs, no gallops Respiratory: CTA bilaterally, no wheezing, no rhonchi Abdominal: Soft, NT, ND, bowel sounds + Extremities: no edema, no cyanosis  Discharge Instructions  Discharge Instructions     Discharge instructions   Complete by: As directed    Follow-up with primary care doctor or cardiology in a week or 2  Please call call MD or return to ER for similar or worsening recurring problem that brought you to hospital or if any fever,nausea/vomiting,abdominal pain, uncontrolled pain, chest pain,  shortness of breath or any other alarming symptoms.  Please follow-up your doctor as instructed in a week time and call the office for appointment.  Please avoid alcohol, smoking, or any other illicit substance and maintain healthy habits including taking your regular medications as prescribed.  You were cared for by a hospitalist during your hospital stay. If you have any  questions about your discharge medications or the care you received while you were in the hospital after you are discharged, you can call the unit and ask to speak with the hospitalist on call if the hospitalist that took care of you is not available.  Once you are discharged, your primary care physician will handle any further medical issues. Please note that NO REFILLS for any discharge medications will be authorized once you are discharged, as it is imperative that you return to your primary care physician (or establish a relationship with a primary care physician if you do not have one) for your aftercare needs so that they can reassess your need for medications and monitor your lab values   Increase activity slowly   Complete by: As directed       Allergies as of 07/25/2021   No Known Allergies      Medication List     TAKE these medications    metoprolol tartrate 25 MG tablet Commonly known as: LOPRESSOR Take 1 tablet (25 mg total) by mouth 2 (two) times daily.   multivitamin Tabs tablet Take 1 tablet by mouth daily.   traMADol 50 MG tablet Commonly known as: Ultram Take 1 tablet (50 mg total) by mouth every 6 (six) hours as needed.        Follow-up Information     Asencion Noble, MD Follow up in 1 week(s).   Specialty: Internal Medicine Contact information: [address] [place] [address] [phone]                No Known Allergies  The results of significant diagnostics from this hospitalization (including imaging, microbiology, ancillary and laboratory) are listed below for reference.    Microbiology: No results found for this or any previous visit (from the past 240 hour(s)).  Procedures/Studies: DG Chest Port 1 View  Result Date: 07/24/2021 CLINICAL DATA:  Pt states he cut his his thumb on a dirty knife. Reason for exam " bradycardia"bradycardia EXAM: PORTABLE CHEST 1 VIEW COMPARISON:  None. FINDINGS: The heart size and mediastinal  contours are within normal limits. Both lungs are clear. The visualized skeletal structures are unremarkable. IMPRESSION: No acute cardiopulmonary process. Electronically Signed   By: Suzy Bouchard M.D.   On: 07/24/2021 18:37   ECHOCARDIOGRAM COMPLETE  Result Date: 07/25/2021    ECHOCARDIOGRAM REPORT   Patient Name:   Erik Murray Date of Exam: 07/25/2021 Medical Rec #:  [id number]           Height:       70.0 in Accession #:    [id number]          Weight:       208.0 lb Date of Birth:  05-16-1946           BSA:          2.122 m Patient Age:    70 years            BP:           149/89 mmHg Patient Gender: M  HR:           42 bpm. Exam Location:  Inpatient Procedure: 2D Echo, Cardiac Doppler and Color Doppler Indications:    Abnormal ECG  History:        Patient has no prior history of Echocardiogram examinations.                 Arrythmias:PVC.  Sonographer:    Melissa Morford RDCS (AE, PE) Referring Phys: [phone] CHRISTOPHER A [place]  1. Challenging assessment of wall motion/EF with frequent ventricular ectopy. Left ventricular ejection fraction, by estimation, is 55 to 60%. The left ventricle has normal function. The left ventricle has no regional wall motion abnormalities. There is  mild left ventricular hypertrophy. Left ventricular diastolic parameters are indeterminate.  2. Right ventricular systolic function is normal. The right ventricular size is normal. Tricuspid regurgitation signal is inadequate for assessing PA pressure.  3. The mitral valve is grossly normal. No evidence of mitral valve regurgitation.  4. Focal calcification. Aortic valve regurgitation is not visualized.  5. The inferior vena cava is normal in size with greater than 50% respiratory variability, suggesting right atrial pressure of 3 mmHg. Comparison(s): No prior Echocardiogram. Conclusion(s)/Recommendation(s): Otherwise normal echocardiogram, with minor abnormalities described in the report.  FINDINGS  Left Ventricle: Challenging assessment of wall motion/EF with frequent ventricular ectopy. Left ventricular ejection fraction, by estimation, is 55 to 60%. The left ventricle has normal function. The left ventricle has no regional wall motion abnormalities. The left ventricular internal cavity size was normal in size. There is mild left ventricular hypertrophy. Left ventricular diastolic parameters are indeterminate. Right Ventricle: The right ventricular size is normal. No increase in right ventricular wall thickness. Right ventricular systolic function is normal. Tricuspid regurgitation signal is inadequate for assessing PA pressure. Left Atrium: Left atrial size was normal in size. Right Atrium: Right atrial size was normal in size. Pericardium: There is no evidence of pericardial effusion. Mitral Valve: The mitral valve is grossly normal. There is mild calcification of the mitral valve leaflet(s). No evidence of mitral valve regurgitation. Tricuspid Valve: The tricuspid valve is grossly normal. Tricuspid valve regurgitation is not demonstrated. Aortic Valve: Focal calcification. Aortic valve regurgitation is not visualized. Pulmonic Valve: The pulmonic valve was not well visualized. Pulmonic valve regurgitation is not visualized. Aorta: The aortic root and ascending aorta are structurally normal, with no evidence of dilitation. Venous: The inferior vena cava is normal in size with greater than 50% respiratory variability, suggesting right atrial pressure of 3 mmHg. IAS/Shunts: No atrial level shunt detected by color flow Doppler.  LEFT VENTRICLE PLAX 2D LVIDd:         5.70 cm LVIDs:         4.10 cm LV PW:         1.10 cm LV IVS:        0.75 cm LVOT diam:     2.30 cm LV SV:         70 LV SV Index:   33 LVOT Area:     4.15 cm  RIGHT VENTRICLE TAPSE (M-mode): 1.1 cm LEFT ATRIUM             Index        RIGHT ATRIUM           Index LA diam:        3.90 cm 1.84 cm/m   RA Area:     20.00 cm LA Vol (A2C):    40.8 ml 19.22 ml/m  RA Volume:   55.00 ml  25.91 ml/m LA Vol (A4C):   46.1 ml 21.72 ml/m LA Biplane Vol: 46.8 ml 22.05 ml/m  AORTIC VALVE LVOT Vmax:   94.00 cm/s LVOT Vmean:  55.100 cm/s LVOT VTI:    0.169 m  AORTA Ao Asc diam: 3.20 cm MITRAL VALVE MV Area (PHT): 3.83 cm    SHUNTS MV Decel Time: 198 msec    Systemic VTI:  0.17 m MV E velocity: 76.70 cm/s  Systemic Diam: 2.30 cm Phineas Inches Electronically signed by Phineas Inches Signature Date/Time: 07/25/2021/12:36:22 PM    Final     Labs: BNP (last 3 results) Recent Labs    07/24/21 1755  BNP 673.4*   Basic Metabolic Panel: Recent Labs  Lab 07/24/21 1748  NA 139  K 4.1  CL 108  CO2 23  GLUCOSE 94  BUN 16  CREATININE 1.00  CALCIUM 8.8*  MG 2.1   Liver Function Tests: No results for input(s): AST, ALT, ALKPHOS, BILITOT, PROT, ALBUMIN in the last 168 hours. No results for input(s): LIPASE, AMYLASE in the last 168 hours. No results for input(s): AMMONIA in the last 168 hours. CBC: Recent Labs  Lab 07/24/21 1748  WBC 9.4  NEUTROABS 5.1  HGB 16.3  HCT 49.0  MCV 96.3  PLT 145*   Cardiac Enzymes: No results for input(s): CKTOTAL, CKMB, CKMBINDEX, TROPONINI in the last 168 hours. BNP: Invalid input(s): POCBNP CBG: No results for input(s): GLUCAP in the last 168 hours. D-Dimer No results for input(s): DDIMER in the last 72 hours. Hgb A1c No results for input(s): HGBA1C in the last 72 hours. Lipid Profile No results for input(s): CHOL, HDL, LDLCALC, TRIG, CHOLHDL, LDLDIRECT in the last 72 hours. Thyroid function studies Recent Labs    07/24/21 1755  TSH 1.708   Anemia work up No results for input(s): VITAMINB12, FOLATE, FERRITIN, TIBC, IRON, RETICCTPCT in the last 72 hours. Urinalysis No results found for: COLORURINE, APPEARANCEUR, LABSPEC, [place], GLUCOSEU, HGBUR, BILIRUBINUR, KETONESUR, PROTEINUR, UROBILINOGEN, NITRITE, LEUKOCYTESUR Sepsis Labs Invalid input(s): PROCALCITONIN,  WBC,   LACTICIDVEN Microbiology No results found for this or any previous visit (from the past 240 hour(s)).   Time coordinating discharge: 25 minutes  SIGNED: Antonieta Pert, MD  [facility] 07/26/2021, 11:40 AM  If 7PM-7AM, please contact night-coverage [email or web address]

## 2021-07-25 NOTE — Consult Note (Addendum)
The patient has been seen in conjunction with Rosaria Ferries, PAC. All aspects of care have been considered and discussed. The patient has been personally interviewed, examined, and all clinical data has been reviewed.  Uniform PVC's with grouped beating of normal sinus beat followed by PVC pair of same morphology. Agree with beta blocker therapy. Plan 48 to 72 hour monitor as OP to quantify PVC burden. Exercise treadmill as OP. Echocardiogram reveals mild LVH. Electrolytes are okay. Cardiac markers are benign except BNP is mildly elevated. May eventually need EP consult after initial w/u as noted above.       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  History of Present Illness:   Mr. Erik Murray went to [facility] today for L thumb lac. When checked, his pulse was 32, ECG rate 87 2nd PVCs >> sent to ER.  Erik Murray Mr Mcquarrie is a regular exerciser. He walks several miles a day with the dog and rides a recumbent bike. He wears an apple watch and his HR will be > 110 w/ exercise.   However, his watch has recently been complaining that his HR is <40. He has not had any presyncope or syncope w/ this.   He has not had any palpitations.  He is completely unaware of his heart skipping  He never gets chest pain, even with significant exertion.  No dyspnea on exertion, no orthopnea or PND.   Past Medical History:  Diagnosis Date   Arthritis    Cancer ([place]) 2016   prostate   Hx of adenomatous colonic polyps     Past Surgical History:  Procedure Laterality Date   COLONOSCOPY  02/05/2012   YDX:AJOINOM and rectal  polyps/few diverticulosis (adenomas). next TCS 01/2015   COLONOSCOPY N/A 02/10/2015   Procedure: COLONOSCOPY;  Surgeon: Daneil Dolin, MD;  [facility];  Service: Endoscopy;  Laterality: N/A;   COLONOSCOPY     COLONOSCOPY N/A 05/15/2018   Procedure: COLONOSCOPY;  Surgeon: Daneil Dolin, MD;  [facility];  Service: Endoscopy;  Laterality: N/A;  7:30   EYE SURGERY Bilateral apirl, may 2013   cataract lens replacement   LYMPHADENECTOMY Bilateral 10/01/2014   Procedure: LYMPHADENECTOMY;  Surgeon: Raynelle Bring, MD;  [facility];  Service: Urology;  Laterality: Bilateral;   POLYPECTOMY  05/15/2018   Procedure: POLYPECTOMY;  Surgeon: Daneil Dolin, MD;  [facility];  Service: Endoscopy;;  recto-sigmoid colon x2; splenic flexure(cs);   PROSTATE BIOPSY  dec 2015   ROBOT ASSISTED LAPAROSCOPIC RADICAL PROSTATECTOMY N/A 10/01/2014   Procedure: ROBOTIC ASSISTED LAPAROSCOPIC RADICAL PROSTATECTOMY LEVEL 2;  Surgeon: Raynelle Bring, MD;  [facility];  Service: Urology;  Laterality: N/A;   TONSILLECTOMY  as child   TRIGGER FINGER RELEASE  10/10/2011   Procedure: RELEASE TRIGGER FINGER/A-1 PULLEY;  Surgeon: Wynonia Sours, MD;  Location: [place];  Service: Orthopedics;  Laterality: Right;  right thumb   TRIGGER FINGER RELEASE Right 05/23/2016   Procedure: RELEASE TRIGGER FINGER/A-1 PULLEY right index finger;  Surgeon: Daryll Brod, MD;  Location: [facility];  Service: Orthopedics;  Laterality: Right;  FAB   TRIGGER FINGER RELEASE Left 07/18/2016   Procedure: RELEASE TRIGGER FINGER/A-1 PULLEY, left index;  Surgeon: Daryll Brod, MD;  Location: [place];  Service: Orthopedics;  Laterality: Left;  FAB   TRIGGER FINGER RELEASE Left 03/29/2021   Procedure: RELEASE TRIGGER FINGER/A-1 PULLEY LEFT MIDDLE FINGER;  Surgeon: Daryll Brod, MD;  Location: [place];  Service: Orthopedics;  Laterality: Left;   YAG LASER  APPLICATION Right 0/35/0093   Procedure: YAG LASER APPLICATION;  Surgeon: Rutherford Guys, MD;  [facility];  Service: Ophthalmology;  Laterality: Right;   YAG LASER APPLICATION Left 03/31/2992   Procedure: YAG LASER APPLICATION;  Surgeon: Rutherford Guys, MD;  [facility];  Service: Ophthalmology;  Laterality: Left;     Home Medications:  Prior to Admission medications   Medication Sig Start Date End Date Taking? Authorizing Provider  multivitamin (ONE-A-DAY MEN'S) TABS tablet Take 1 tablet by mouth daily.   Yes [provider]  traMADol (ULTRAM) 50 MG tablet Take 1 tablet (50 mg total) by mouth every 6 (six) hours as needed. Patient not taking: Reported on 07/24/2021 03/29/21   Daryll Brod, MD    Inpatient Medications: Scheduled Meds:  Continuous Infusions:  PRN Meds:   Allergies:   No Known Allergies  Social History:   Social History   Socioeconomic History   Marital status: Married    Spouse name: Not on file   Number of children: 2   Years of education: Not on file   Highest education level: Not on file  Occupational History   Not on file  Tobacco Use   Smoking status: Former    Packs/day: 1.00    Years: 40.00    Pack years: 40.00    Types: Cigarettes   Smokeless tobacco: Former    Quit date: 10/03/2005  Vaping Use   Vaping Use: Never used  Substance and Sexual Activity   Alcohol use: Yes    Alcohol/week: 6.0 standard drinks    Types: 6 Standard drinks or equivalent per week    Comment: occasional   Drug use: No   Sexual activity: Not on file  Other Topics Concern   Not on file  Social History Narrative   Not on file   Social Determinants of Health   Financial Resource Strain: Not on file  Food Insecurity: Not on file  Transportation Needs: Not on file  Physical Activity: Not on file  Stress: Not on file  Social Connections: Not on file  Intimate Partner Violence: Not on file    Family History:   Family History  Problem Relation Age  of Onset   Diabetes Mother    Heart disease Mother    Other Father        age 68, deceased form of lupus   Colon cancer Neg Hx      ROS:  Please see the history of present illness.  All other ROS reviewed and negative.     Physical Exam/Data:   Vitals:   07/25/21 1045 07/25/21 1100 07/25/21 1135 07/25/21 1200  BP: (!) 151/89 (!) 144/79  (!) 145/65  Pulse: 95 (!) 32 76 (!) 29  Resp: 15 19 17 17   Temp:      TempSrc:      SpO2: 97% 97% 94% 94%  Weight:      Height:        Intake/Output Summary (Last 24 hours) at 07/25/2021 1302 Last data  filed at 07/24/2021 2334 Gross per 24 hour  Intake --  Output 850 ml  Net -850 ml   Last 3 Weights 07/24/2021 03/29/2021 03/21/2021  Weight (lbs) 208 lb 219 lb 12.8 oz 215 lb  Weight (kg) 94.348 kg 99.7 kg 97.523 kg     Body mass index is 29.84 kg/m.  General:  Well nourished, well developed, in no acute distress HEENT: normal Neck: no JVD Vascular: No carotid bruits; Distal pulses 2+ bilaterally Cardiac:  normal S1, S2; RRR; no murmur  Lungs:  clear to auscultation bilaterally, no wheezing, rhonchi or rales  Abd: soft, nontender, no hepatomegaly  Ext: no edema Musculoskeletal:  No deformities, BUE and BLE strength normal and equal Skin: warm and dry  Neuro:  CNs 2-12 intact, no focal abnormalities noted Psych:  Normal affect   EKG:  The EKG was personally reviewed and demonstrates:  ST, HR 114, PVCs, NSVT 5 bts Telemetry:  Telemetry was personally reviewed and demonstrates: Sinus rhythm with frequent PVCs and pairs, bigeminy and trigeminy  Relevant CV Studies:  ECHO: 07/25/2021  1. Challenging assessment of wall motion/EF with frequent ventricular ectopy. Left ventricular ejection fraction, by estimation, is 55 to 60%. The left ventricle has normal function. The left ventricle has no regional wall motion abnormalities. There is mild left ventricular hypertrophy. Left ventricular diastolic parameters  are indeterminate.   2.  Right ventricular systolic function is normal. The right ventricular  size is normal. Tricuspid regurgitation signal is inadequate for assessing  PA pressure.   3. The mitral valve is grossly normal. No evidence of mitral valve  regurgitation.   4. Focal calcification. Aortic valve regurgitation is not visualized.   5. The inferior vena cava is normal in size with greater than 50%  respiratory variability, suggesting right atrial pressure of 3 mmHg.   Laboratory Data:  High Sensitivity Troponin:   Recent Labs  Lab 07/24/21 1748 07/24/21 2014  TROPONINIHS 9 11     Chemistry Recent Labs  Lab 07/24/21 1748  NA 139  K 4.1  CL 108  CO2 23  GLUCOSE 94  BUN 16  CREATININE 1.00  CALCIUM 8.8*  MG 2.1  GFRNONAA >60  ANIONGAP 8    No results for input(s): PROT, ALBUMIN, AST, ALT, ALKPHOS, BILITOT in the last 168 hours. Lipids No results for input(s): CHOL, TRIG, HDL, LABVLDL, LDLCALC, CHOLHDL in the last 168 hours.  Hematology Recent Labs  Lab 07/24/21 1748  WBC 9.4  RBC 5.09  HGB 16.3  HCT 49.0  MCV 96.3  MCH 32.0  MCHC 33.3  RDW 13.5  PLT 145*   Thyroid  Recent Labs  Lab 07/24/21 1755 07/24/21 2014  TSH 1.708  --   FREET4  --  1.02    BNP Recent Labs  Lab 07/24/21 1755  BNP 203.8*    DDimer No results for input(s): DDIMER in the last 168 hours.   Radiology/Studies:  DG Chest Port 1 View  Result Date: 07/24/2021 CLINICAL DATA:  Pt states he cut his his thumb on a dirty knife. Reason for exam " bradycardia"bradycardia EXAM: PORTABLE CHEST 1 VIEW COMPARISON:  None. FINDINGS: The heart size and mediastinal contours are within normal limits. Both lungs are clear. The visualized skeletal structures are unremarkable. IMPRESSION: No acute cardiopulmonary process. Electronically Signed   By: Suzy Bouchard M.D.   On: 07/24/2021 18:37   ECHOCARDIOGRAM COMPLETE  Result Date: 07/25/2021    ECHOCARDIOGRAM REPORT   Patient Name:   Erik Murray  Date of  Exam: 07/25/2021 Medical Rec #:  [id number]           Height:       70.0 in Accession #:    [id number]          Weight:       208.0 lb Date of Birth:  21-Sep-1945           BSA:          2.122 m Patient Age:    34 years            BP:           149/89 mmHg Patient Gender: M                   HR:           42 bpm. Exam Location:  Inpatient Procedure: 2D Echo, Cardiac Doppler and Color Doppler Indications:    Abnormal ECG  History:        Patient has no prior history of Echocardiogram examinations.                 Arrythmias:PVC.  Sonographer:    Melissa Morford RDCS (AE, PE) Referring Phys: [phone] CHRISTOPHER A [place]  1. Challenging assessment of wall motion/EF with frequent ventricular ectopy. Left ventricular ejection fraction, by estimation, is 55 to 60%. The left ventricle has normal function. The left ventricle has no regional wall motion abnormalities. There is  mild left ventricular hypertrophy. Left ventricular diastolic parameters are indeterminate.  2. Right ventricular systolic function is normal. The right ventricular size is normal. Tricuspid regurgitation signal is inadequate for assessing PA pressure.  3. The mitral valve is grossly normal. No evidence of mitral valve regurgitation.  4. Focal calcification. Aortic valve regurgitation is not visualized.  5. The inferior vena cava is normal in size with greater than 50% respiratory variability, suggesting right atrial pressure of 3 mmHg. Comparison(s): No prior Echocardiogram. Conclusion(s)/Recommendation(s): Otherwise normal echocardiogram, with minor abnormalities described in the report. FINDINGS  Left Ventricle: Challenging assessment of wall motion/EF with frequent ventricular ectopy. Left ventricular ejection fraction, by estimation, is 55 to 60%. The left ventricle has normal function. The left ventricle has no regional wall motion abnormalities. The left ventricular internal cavity size was normal in size. There is mild left  ventricular hypertrophy. Left ventricular diastolic parameters are indeterminate. Right Ventricle: The right ventricular size is normal. No increase in right ventricular wall thickness. Right ventricular systolic function is normal. Tricuspid regurgitation signal is inadequate for assessing PA pressure. Left Atrium: Left atrial size was normal in size. Right Atrium: Right atrial size was normal in size. Pericardium: There is no evidence of pericardial effusion. Mitral Valve: The mitral valve is grossly normal. There is mild calcification of the mitral valve leaflet(s). No evidence of mitral valve regurgitation. Tricuspid Valve: The tricuspid valve is grossly normal. Tricuspid valve regurgitation is not demonstrated. Aortic Valve: Focal calcification. Aortic valve regurgitation is not visualized. Pulmonic Valve: The pulmonic valve was not well visualized. Pulmonic valve regurgitation is not visualized. Aorta: The aortic root and ascending aorta are structurally normal, with no evidence of dilitation. Venous: The inferior vena cava is normal in size with greater than 50% respiratory variability, suggesting right atrial pressure of 3 mmHg. IAS/Shunts: No atrial level shunt detected by color flow Doppler.  LEFT VENTRICLE PLAX 2D LVIDd:         5.70 cm LVIDs:         4.10  cm LV PW:         1.10 cm LV IVS:        0.75 cm LVOT diam:     2.30 cm LV SV:         70 LV SV Index:   33 LVOT Area:     4.15 cm  RIGHT VENTRICLE TAPSE (M-mode): 1.1 cm LEFT ATRIUM             Index        RIGHT ATRIUM           Index LA diam:        3.90 cm 1.84 cm/m   RA Area:     20.00 cm LA Vol (A2C):   40.8 ml 19.22 ml/m  RA Volume:   55.00 ml  25.91 ml/m LA Vol (A4C):   46.1 ml 21.72 ml/m LA Biplane Vol: 46.8 ml 22.05 ml/m  AORTIC VALVE LVOT Vmax:   94.00 cm/s LVOT Vmean:  55.100 cm/s LVOT VTI:    0.169 m  AORTA Ao Asc diam: 3.20 cm MITRAL VALVE MV Area (PHT): 3.83 cm    SHUNTS MV Decel Time: 198 msec    Systemic VTI:  0.17 m MV E  velocity: 76.70 cm/s  Systemic Diam: 2.30 cm Placido Sou signed by Phineas Inches Signature Date/Time: 07/25/2021/12:36:22 PM    Final      Assessment and Plan:   Frequent ventricular ectopy -Although his effective heart rate is reduced to the 30s and 40s, he has been asymptomatic -Curb-sided EP PA and he advised that the standard treatment for this is to start a beta-blocker and then follow-up with the EP in the office -Based on P-P intervals, his underlying heart rate is in the 60s. -His EF is normal with no wall motion abnormalities no significant valvular abnormalities by echo -Follow-up with EP as an outpatient - MD advise if he needs a monitor at discharge.  Otherwise, per IM    Risk Assessment/Risk Scores:      For questions or updates, please contact [place] Please consult [email or web address] for contact info under    Signed, Rosaria Ferries, PA-C  07/25/2021 1:02 PM

## 2021-07-26 ENCOUNTER — Telehealth: Payer: Self-pay | Admitting: Interventional Cardiology

## 2021-07-26 DIAGNOSIS — I493 Ventricular premature depolarization: Secondary | ICD-10-CM

## 2021-07-26 NOTE — Telephone Encounter (Signed)
Yes, to see if exercise suppresses PVCs or causes more severe ventricular ectopy.  ----- Message -----  From: Loren Racer, RN  Sent: 07/26/2021   9:15 AM EST  To: Belva Crome, MD, Loren Racer, RN   Spoke with pt and reviewed recommendations and instructions for test.  Advised I will place order and scheduling will contact him to get the test scheduled.  Pt verbalized understanding and was in agreement with plan.   Will route to Dr. Tamala Julian to sign attestation.

## 2021-07-26 NOTE — Telephone Encounter (Signed)
New Message:     Patient said he was in the hospital and saw Dr Tamala Julian yesterday. He said Dr Tamala Julian said he wanted him to have a Stress Test this week. I did not see an order for a Stress Test. I told him once we received the order we could schedule the test, if we had the availability.

## 2021-07-26 NOTE — Telephone Encounter (Signed)
Are you wanting a GXT on this pt for PVCs?

## 2021-07-28 ENCOUNTER — Ambulatory Visit (INDEPENDENT_AMBULATORY_CARE_PROVIDER_SITE_OTHER): Payer: Medicare PPO

## 2021-07-28 ENCOUNTER — Other Ambulatory Visit: Payer: Self-pay | Admitting: *Deleted

## 2021-07-28 ENCOUNTER — Other Ambulatory Visit: Payer: Self-pay

## 2021-07-28 DIAGNOSIS — I493 Ventricular premature depolarization: Secondary | ICD-10-CM

## 2021-07-28 LAB — EXERCISE TOLERANCE TEST
Angina Index: 0
Duke Treadmill Score: 8
Estimated workload: 10.1
Exercise duration (min): 8 min
Exercise duration (sec): 0 s
MPHR: 146 {beats}/min
Peak HR: 137 {beats}/min
Percent HR: 93 %
RPE: 15
Rest HR: 79 {beats}/min
ST Depression (mm): 0 mm

## 2021-07-28 NOTE — Progress Notes (Signed)
GXT done today and showed to DOD.  Per DOD Dr. Johney Frame, pt had frequent PVCs on GXT.  Refer to EP to be seen, being Dr. Tamala Julian is out of the office today.  Referral placed and Dr. Thompson Caul RN as well as EP Scheduler aware of referral and to follow-up with the pt.

## 2021-08-02 DIAGNOSIS — I493 Ventricular premature depolarization: Secondary | ICD-10-CM | POA: Diagnosis not present

## 2021-08-03 ENCOUNTER — Encounter: Payer: Self-pay | Admitting: Emergency Medicine

## 2021-08-03 ENCOUNTER — Ambulatory Visit
Admission: EM | Admit: 2021-08-03 | Discharge: 2021-08-03 | Disposition: A | Discharge status: Home / Self Care | Payer: Medicare PPO

## 2021-08-03 DIAGNOSIS — Z4802 Encounter for removal of sutures: Secondary | ICD-10-CM

## 2021-08-03 NOTE — ED Notes (Signed)
6 sutures removed from left thumb

## 2021-08-03 NOTE — ED Triage Notes (Signed)
Here for suture remove from 6 sutures in left thumb

## 2021-08-05 ENCOUNTER — Other Ambulatory Visit: Payer: Self-pay | Admitting: Physician Assistant

## 2021-08-05 DIAGNOSIS — I493 Ventricular premature depolarization: Secondary | ICD-10-CM

## 2021-09-07 ENCOUNTER — Ambulatory Visit: Payer: Medicare PPO | Admitting: Physician Assistant

## 2021-09-08 ENCOUNTER — Encounter: Payer: Self-pay | Admitting: Cardiology

## 2021-09-08 ENCOUNTER — Other Ambulatory Visit: Payer: Self-pay

## 2021-09-08 ENCOUNTER — Ambulatory Visit: Payer: Medicare PPO | Admitting: Cardiology

## 2021-09-08 VITALS — BP 136/68 | HR 83 | Ht 70.0 in | Wt 214.6 lb

## 2021-09-08 DIAGNOSIS — I493 Ventricular premature depolarization: Secondary | ICD-10-CM

## 2021-09-08 MED ORDER — FLECAINIDE ACETATE 100 MG PO TABS: 100.0000 mg | ORAL_TABLET | Freq: Two times a day (BID) | ORAL | 3 refills | Status: DC

## 2021-09-08 NOTE — Progress Notes (Signed)
Electrophysiology Office Note   Date:  09/08/2021   ID:  JAHLEN BOLLMAN, DOB 30-May-1946, MRN [id number]  PCP:  Asencion Noble, MD  Cardiologist:  Tamala Julian Primary Electrophysiologist:  Penda Venturi Meredith Leeds, MD    Chief Complaint: PVC   History of Present Illness: ABDALRAHMAN CLEMENTSON is a 76 y.o. male who is being seen today for the evaluation of PVC at the request of Belva Crome, MD. Presenting today for electrophysiology evaluation.  He has a history of PVCs.  He presented to the emergency room for right thumb laceration and was found to have a low heart rate.  ECG showed PVCs.  He wore a cardiac monitor which showed a significantly elevated PVC burden.  Patient was not having palpitations.  He is unaware of his arrhythmia.  He does not get chest pain even with significant exertion.  Today, he denies symptoms of palpitations, chest pain, shortness of breath, orthopnea, PND, lower extremity edema, claudication, dizziness, presyncope, syncope, bleeding, or neurologic sequela. The patient is tolerating medications without difficulties.    Past Medical History:  Diagnosis Date   Arthritis    Cancer ([place]) 2016   prostate   Hx of adenomatous colonic polyps    Past Surgical History:  Procedure Laterality Date   COLONOSCOPY  02/05/2012   HGD:JMEQAST and rectal polyps/few diverticulosis (adenomas). next TCS 01/2015   COLONOSCOPY N/A 02/10/2015   Procedure: COLONOSCOPY;  Surgeon: Daneil Dolin, MD;  [facility];  Service: Endoscopy;  Laterality: N/A;   COLONOSCOPY     COLONOSCOPY N/A 05/15/2018   Procedure: COLONOSCOPY;  Surgeon: Daneil Dolin, MD;  [facility];  Service: Endoscopy;  Laterality: N/A;  7:30   EYE SURGERY Bilateral apirl, may 2013   cataract lens replacement   LYMPHADENECTOMY Bilateral 10/01/2014   Procedure: LYMPHADENECTOMY;  Surgeon: Raynelle Bring, MD;  [facility];  Service: Urology;  Laterality: Bilateral;   POLYPECTOMY  05/15/2018    Procedure: POLYPECTOMY;  Surgeon: Daneil Dolin, MD;  [facility];  Service: Endoscopy;;  recto-sigmoid colon x2; splenic flexure(cs);   PROSTATE BIOPSY  dec 2015   ROBOT ASSISTED LAPAROSCOPIC RADICAL PROSTATECTOMY N/A 10/01/2014   Procedure: ROBOTIC ASSISTED LAPAROSCOPIC RADICAL PROSTATECTOMY LEVEL 2;  Surgeon: Raynelle Bring, MD;  [facility];  Service: Urology;  Laterality: N/A;   TONSILLECTOMY  as child   TRIGGER FINGER RELEASE  10/10/2011   Procedure: RELEASE TRIGGER FINGER/A-1 PULLEY;  Surgeon: Wynonia Sours, MD;  Location: [place];  Service: Orthopedics;  Laterality: Right;  right thumb   TRIGGER FINGER RELEASE Right 05/23/2016   Procedure: RELEASE TRIGGER FINGER/A-1 PULLEY right index finger;  Surgeon: Daryll Brod, MD;  Location: [place];  Service: Orthopedics;  Laterality: Right;  FAB   TRIGGER FINGER RELEASE Left 07/18/2016   Procedure: RELEASE TRIGGER FINGER/A-1 PULLEY, left index;  Surgeon: Daryll Brod, MD;  Location: [place];  Service: Orthopedics;  Laterality: Left;  FAB   TRIGGER FINGER RELEASE Left 03/29/2021   Procedure: RELEASE TRIGGER FINGER/A-1 PULLEY LEFT MIDDLE FINGER;  Surgeon: Daryll Brod, MD;  Location: [place];  Service: Orthopedics;  Laterality: Left;   YAG LASER APPLICATION Right 11/30/6220   Procedure: YAG LASER APPLICATION;  Surgeon: Rutherford Guys, MD;  [facility];  Service: Ophthalmology;  Laterality: Right;   YAG LASER APPLICATION Left 9/79/8921   Procedure: YAG LASER APPLICATION;  Surgeon: Rutherford Guys, MD;  [facility];  Service: Ophthalmology;  Laterality: Left;     Current Outpatient Medications  Medication Sig Dispense Refill   flecainide (TAMBOCOR) 100 MG tablet Take 1 tablet (100 mg total) by mouth 2 (two) times daily. 60 tablet 3   metoprolol tartrate (LOPRESSOR) 25 MG tablet Take 1 tablet (25 mg total) by mouth 2 (two) times daily. 60 tablet 3   multivitamin  (ONE-A-DAY MEN'S) TABS tablet Take 1 tablet by mouth daily.     traMADol (ULTRAM) 50 MG tablet Take 1 tablet (50 mg total) by mouth every 6 (six) hours as needed. (Patient not taking: Reported on 07/24/2021) 20 tablet 0   No current facility-administered medications for this visit.    Allergies:   Patient has no known allergies.   Social History:  The patient  reports that he has quit smoking. His smoking use included cigarettes. He has a 40.00 pack-year smoking history. He quit smokeless tobacco use about 15 years ago. He reports current alcohol use of about 6.0 standard drinks per week. He reports that he does not use drugs.   Family History:  The patient's family history includes Diabetes in his mother; Heart disease in his mother; Other in his father.    ROS:  Please see the history of present illness.   Otherwise, review of systems is positive for none.   All other systems are reviewed and negative.    PHYSICAL EXAM: VS:  BP 136/68    Pulse 83    Ht 5\' 10"  (1.778 m)    Wt 214 lb 9.6 oz (97.3 kg)    SpO2 95%    BMI 30.79 kg/m  , BMI Body mass index is 30.79 kg/m. GEN: Well nourished, well developed, in no acute distress  HEENT: normal  Neck: no JVD, carotid bruits, or masses Cardiac: irregular; no murmurs, rubs, or gallops,no edema  Respiratory:  clear to auscultation bilaterally, normal work of breathing GI: soft, nontender, nondistended, + BS MS: no deformity or atrophy  Skin: warm and dry Neuro:  Strength and sensation are intact Psych: euthymic mood, full affect  EKG:  EKG is ordered today. Personal review of the ekg ordered shows sinus rhythm, PVCs with couplets  Recent Labs: 07/24/2021: B Natriuretic Peptide 203.8; BUN 16; Creatinine, Ser 1.00; Hemoglobin 16.3; Magnesium 2.1; Platelets 145; Potassium 4.1; Sodium 139; TSH 1.708    Lipid Panel  No results found for: CHOL, TRIG, HDL, CHOLHDL, VLDL, LDLCALC, LDLDIRECT   Wt Readings from Last 3 Encounters:  09/08/21  214 lb 9.6 oz (97.3 kg)  07/24/21 208 lb (94.3 kg)  03/29/21 219 lb 12.8 oz (99.7 kg)      Other studies Reviewed: Additional studies/ records that were reviewed today include: TTE 07/25/21  Review of the above records today demonstrates:   1. Challenging assessment of wall motion/EF with frequent ventricular  ectopy. Left ventricular ejection fraction, by estimation, is 55 to 60%.  The left ventricle has normal function. The left ventricle has no regional  wall motion abnormalities. There is   mild left ventricular hypertrophy. Left ventricular diastolic parameters  are indeterminate.   2. Right ventricular systolic function is normal. The right ventricular  size is normal. Tricuspid regurgitation signal is inadequate for assessing  PA pressure.   3. The mitral valve is grossly normal. No evidence of mitral valve  regurgitation.   4. Focal calcification. Aortic valve regurgitation is not visualized.   5. The inferior vena cava is normal in size with greater than 50%  respiratory variability, suggesting right atrial  pressure of 3 mmHg.   Cardiac monitor 08/20/2021 personally reviewed Basic rhythm is NSR and Sinus bradycardia Complex and high burden ventricular ectopy. Isolated PVC burden 8%; Ventricular couplet burden 40%; Ventricular triplet burden 2.5% Rare SVT with longest episode 13 beats.     ASSESSMENT AND PLAN:  1.  PVCs: Significantly elevated burden on his cardiac monitor.  Have an outflow tract morphology.  The patient states that he has not been symptomatic.  He is able to do all of his daily activities.  I am concerned that he is unaware of symptoms and that if we can reduce his PVC burden that he Suzzanne Brunkhorst have more energy.  We Rachid Parham start him on flecainide 100 mg twice daily.  We Mertis Mosher bring him back in 2 weeks for an ECG.    Current medicines are reviewed at length with the patient today.   The patient does not have concerns regarding his medicines.  The following changes  were made today: Start flecainide  Labs/ tests ordered today include:  Orders Placed This Encounter  Procedures   EKG 12-Lead     Disposition:   FU with Lainie Daubert 6 weeks  Signed, Cy Bresee Meredith Leeds, MD  09/08/2021 12:26 PM     [facility] [address] [place] [address] [phone] (office) [phone] (fax)

## 2021-09-08 NOTE — Patient Instructions (Addendum)
Medication Instructions:  Your physician has recommended you make the following change in your medication:   START: Flecainide 100mg  twice daily on 09/15/2021  *If you need a refill on your cardiac medications before your next appointment, please call your pharmacy*   Lab Work: None If you have labs (blood work) drawn today and your tests are completely normal, you will receive your results only by: [place] (if you have MyChart) OR A paper copy in the mail If you have any lab test that is abnormal or we need to change your treatment, we will call you to review the results.   Follow-Up: At [facility], you and your health needs are our priority.  As part of our continuing mission to provide you with exceptional heart care, we have created designated Provider Care Teams.  These Care Teams include your primary Cardiologist (physician) and Advanced Practice Providers (APPs -  Physician Assistants and Nurse Practitioners) who all work together to provide you with the care you need, when you need it.   Your next appointment:    Nurse visit on 09/29/2021 @ 11:00 for EKG (already scheduled) 6 week(s)  The format for your next appointment:   In Person  Provider:   Allegra Lai, MD

## 2021-09-22 ENCOUNTER — Ambulatory Visit: Payer: Medicare PPO

## 2021-09-26 DIAGNOSIS — H52203 Unspecified astigmatism, bilateral: Secondary | ICD-10-CM | POA: Diagnosis not present

## 2021-09-26 DIAGNOSIS — Z961 Presence of intraocular lens: Secondary | ICD-10-CM | POA: Diagnosis not present

## 2021-09-29 ENCOUNTER — Ambulatory Visit (INDEPENDENT_AMBULATORY_CARE_PROVIDER_SITE_OTHER): Payer: Medicare PPO | Admitting: *Deleted

## 2021-09-29 ENCOUNTER — Other Ambulatory Visit: Payer: Self-pay

## 2021-09-29 VITALS — BP 158/78 | HR 41 | Ht 70.0 in

## 2021-09-29 DIAGNOSIS — I493 Ventricular premature depolarization: Secondary | ICD-10-CM

## 2021-09-29 DIAGNOSIS — Z79899 Other long term (current) drug therapy: Secondary | ICD-10-CM | POA: Diagnosis not present

## 2021-09-29 MED ORDER — METOPROLOL TARTRATE 25 MG PO TABS: 12.5000 mg | ORAL_TABLET | Freq: Two times a day (BID) | ORAL | 3 refills | Status: DC

## 2021-09-29 NOTE — Progress Notes (Signed)
1.  Reason for visit: EKG post Flecainide start  2.  Name of MD requesting visit:  Camnitz  3. H&P:    4.  ROS related to problem:  n/a  5.  Assessment and plan per MD:   EKG performed after Flecainide start. EKG showing SB rate 41 QT/QTc 516/425 ms Reviewed with DOD.  Will have pt reduce his Lopressor to 12.5 BID to see if improvement in HRs.  Will have Dr. Curt Bears review to determine if pt should remain on Lopressor or hold.    Pt reports his HRs avg 40s according to his watch, this is daily.  Pt does not have any symptoms with low HRs. Aware it may be week/two before hearing back from me as MD is out of the office until next week. Pt is agreeable to this plan.

## 2021-10-20 NOTE — Progress Notes (Unsigned)
PCP:  Asencion Noble, MD Primary Cardiologist: None Electrophysiologist: Will Meredith Leeds, MD   Erik Murray is a 76 y.o. male seen today for Will Meredith Leeds, MD for routine electrophysiology followup.    Seen in office 09/29/21 s/p flecainide start and noted to have bradycardia. Lopressor decreased.   Since last being seen in our clinic the patient reports doing ***.  he denies chest pain, palpitations, dyspnea, PND, orthopnea, nausea, vomiting, dizziness, syncope, edema, weight gain, or early satiety.  Past Medical History:  Diagnosis Date   Arthritis    Cancer ([place]) 2016   prostate   Hx of adenomatous colonic polyps    Past Surgical History:  Procedure Laterality Date   COLONOSCOPY  02/05/2012   [id number] and rectal polyps/few diverticulosis (adenomas). next TCS 01/2015   COLONOSCOPY N/A 02/10/2015   Procedure: COLONOSCOPY;  Surgeon: Daneil Dolin, MD;  [facility];  Service: Endoscopy;  Laterality: N/A;   COLONOSCOPY     COLONOSCOPY N/A 05/15/2018   Procedure: COLONOSCOPY;  Surgeon: Daneil Dolin, MD;  [facility];  Service: Endoscopy;  Laterality: N/A;  7:30   EYE SURGERY Bilateral apirl, may 2013   cataract lens replacement   LYMPHADENECTOMY Bilateral 10/01/2014   Procedure: LYMPHADENECTOMY;  Surgeon: Raynelle Bring, MD;  [facility];  Service: Urology;  Laterality: Bilateral;   POLYPECTOMY  05/15/2018   Procedure: POLYPECTOMY;  Surgeon: Daneil Dolin, MD;  [facility];  Service: Endoscopy;;  recto-sigmoid colon x2; splenic flexure(cs);   PROSTATE BIOPSY  dec 2015   ROBOT ASSISTED LAPAROSCOPIC RADICAL PROSTATECTOMY N/A 10/01/2014   Procedure: ROBOTIC ASSISTED LAPAROSCOPIC RADICAL PROSTATECTOMY LEVEL 2;  Surgeon: Raynelle Bring, MD;  [facility];  Service: Urology;  Laterality: N/A;   TONSILLECTOMY  as child   TRIGGER FINGER RELEASE  10/10/2011   Procedure: RELEASE TRIGGER FINGER/A-1 PULLEY;  Surgeon: Wynonia Sours, MD;   Location: [place];  Service: Orthopedics;  Laterality: Right;  right thumb   TRIGGER FINGER RELEASE Right 05/23/2016   Procedure: RELEASE TRIGGER FINGER/A-1 PULLEY right index finger;  Surgeon: Daryll Brod, MD;  Location: [place];  Service: Orthopedics;  Laterality: Right;  FAB   TRIGGER FINGER RELEASE Left 07/18/2016   Procedure: RELEASE TRIGGER FINGER/A-1 PULLEY, left index;  Surgeon: Daryll Brod, MD;  Location: [place];  Service: Orthopedics;  Laterality: Left;  FAB   TRIGGER FINGER RELEASE Left 03/29/2021   Procedure: RELEASE TRIGGER FINGER/A-1 PULLEY LEFT MIDDLE FINGER;  Surgeon: Daryll Brod, MD;  Location: [place];  Service: Orthopedics;  Laterality: Left;   YAG LASER APPLICATION Right 03/23/1750   Procedure: YAG LASER APPLICATION;  Surgeon: Rutherford Guys, MD;  [facility];  Service: Ophthalmology;  Laterality: Right;   YAG LASER APPLICATION Left 0/25/8527   Procedure: YAG LASER APPLICATION;  Surgeon: Rutherford Guys, MD;  [facility];  Service: Ophthalmology;  Laterality: Left;    Current Outpatient Medications  Medication Sig Dispense Refill   flecainide (TAMBOCOR) 100 MG tablet Take 1 tablet (100 mg total) by mouth 2 (two) times daily. 60 tablet 3   metoprolol tartrate (LOPRESSOR) 25 MG tablet Take 0.5 tablets (12.5 mg total) by mouth 2 (two) times daily. 90 tablet 3   multivitamin (ONE-A-DAY MEN'S) TABS tablet Take 1 tablet by mouth daily.     traMADol (ULTRAM) 50 MG tablet Take 1 tablet (50 mg total) by mouth every 6 (six) hours as needed. (Patient not  taking: Reported on 07/24/2021) 20 tablet 0   No current facility-administered medications for this visit.    No Known Allergies  Social History   Socioeconomic History   Marital status: Married    Spouse name: Not on file   Number of children: 2   Years of education: Not on file   Highest education level: Not on file  Occupational History   Not on  file  Tobacco Use   Smoking status: Former    Packs/day: 1.00    Years: 40.00    Pack years: 40.00    Types: Cigarettes   Smokeless tobacco: Former    Quit date: 10/03/2005  Vaping Use   Vaping Use: Never used  Substance and Sexual Activity   Alcohol use: Yes    Alcohol/week: 6.0 standard drinks    Types: 6 Standard drinks or equivalent per week    Comment: occasional   Drug use: No   Sexual activity: Not on file  Other Topics Concern   Not on file  Social History Narrative   Not on file   Social Determinants of Health   Financial Resource Strain: Not on file  Food Insecurity: Not on file  Transportation Needs: Not on file  Physical Activity: Not on file  Stress: Not on file  Social Connections: Not on file  Intimate Partner Violence: Not on file     Review of Systems: All other systems reviewed and are otherwise negative except as noted above.  Physical Exam: There were no vitals filed for this visit.  GEN- The patient is well appearing, alert and oriented x 3 today.   HEENT: normocephalic, atraumatic; sclera clear, conjunctiva pink; hearing intact; oropharynx clear; neck supple, no JVP Lymph- no cervical lymphadenopathy Lungs- Clear to ausculation bilaterally, normal work of breathing.  No wheezes, rales, rhonchi Heart- Regular rate and rhythm, no murmurs, rubs or gallops, PMI not laterally displaced GI- soft, non-tender, non-distended, bowel sounds present, no hepatosplenomegaly Extremities- no clubbing, cyanosis, or edema; DP/PT/radial pulses 2+ bilaterally MS- no significant deformity or atrophy Skin- warm and dry, no rash or lesion Psych- euthymic mood, full affect Neuro- strength and sensation are intact  EKG is ordered. Personal review of EKG from today shows ***  Additional studies reviewed include: Previous EP office notes.   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   Denies  symptoms with daily activities.   2. Bradycardia  Follow up with {Blank single:19197::"Dr. Allred","Dr. Arlan Organ. Klein","Dr. Camnitz","Dr. Lambert","EP APP"} in {Blank single:19197::"2 weeks","4 weeks","3 months","6 months","12 months","as usual post gen change"}   Shirley Friar, PA-C  10/20/21 9:27 AM

## 2021-10-28 ENCOUNTER — Encounter: Payer: Self-pay | Admitting: Student

## 2021-10-28 ENCOUNTER — Ambulatory Visit: Payer: Medicare PPO | Admitting: Student

## 2021-10-28 ENCOUNTER — Other Ambulatory Visit: Payer: Self-pay

## 2021-10-28 ENCOUNTER — Ambulatory Visit (INDEPENDENT_AMBULATORY_CARE_PROVIDER_SITE_OTHER): Payer: Medicare PPO

## 2021-10-28 VITALS — BP 130/67 | HR 62 | Ht 70.0 in | Wt 216.0 lb

## 2021-10-28 DIAGNOSIS — I493 Ventricular premature depolarization: Secondary | ICD-10-CM | POA: Diagnosis not present

## 2021-10-28 DIAGNOSIS — R001 Bradycardia, unspecified: Secondary | ICD-10-CM

## 2021-10-28 LAB — BASIC METABOLIC PANEL
BUN/Creatinine Ratio: 21 (ref 10–24)
BUN: 20 mg/dL (ref 8–27)
CO2: 24 mmol/L (ref 20–29)
Calcium: 9.5 mg/dL (ref 8.6–10.2)
Chloride: 105 mmol/L (ref 96–106)
Creatinine, Ser: 0.97 mg/dL (ref 0.76–1.27)
Glucose: 92 mg/dL (ref 70–99)
Potassium: 4.4 mmol/L (ref 3.5–5.2)
Sodium: 142 mmol/L (ref 134–144)
eGFR: 81 mL/min/{1.73_m2} (ref >59)

## 2021-10-28 LAB — MAGNESIUM: Magnesium: 2.2 mg/dL (ref 1.6–2.3)

## 2021-10-28 NOTE — Patient Instructions (Addendum)
Medication Instructions:  ?Your physician recommends that you continue on your current medications as directed. Please refer to the Current Medication list given to you today. ? ?*If you need a refill on your cardiac medications before your next appointment, please call your pharmacy* ? ? ?Lab Work: ?TODAY: BMET, [place] ? ?If you have labs (blood work) drawn today and your tests are completely normal, you will receive your results only by: ?MyChart Message (if you have MyChart) OR ?A paper copy in the mail ?If you have any lab test that is abnormal or we need to change your treatment, we will call you to review the results. ? ? ?Follow-Up: ?At [facility], you and your health needs are our priority.  As part of our continuing mission to provide you with exceptional heart care, we have created designated Provider Care Teams.  These Care Teams include your primary Cardiologist (physician) and Advanced Practice Providers (APPs -  Physician Assistants and Nurse Practitioners) who all work together to provide you with the care you need, when you need it. ? ? ?Your next appointment:   ?3 month(s) ? ?The format for your next appointment:   ?In Person ? ?Provider:   ?Allegra Lai, MD{ ? ? ?

## 2021-10-28 NOTE — Progress Notes (Unsigned)
Applied a 3 day Zio XT monitor to patient in the office  ? ?Camnitz to read ?

## 2021-11-07 DIAGNOSIS — I493 Ventricular premature depolarization: Secondary | ICD-10-CM | POA: Diagnosis not present

## 2021-11-07 DIAGNOSIS — R001 Bradycardia, unspecified: Secondary | ICD-10-CM | POA: Diagnosis not present

## 2021-12-15 ENCOUNTER — Other Ambulatory Visit: Payer: Self-pay | Admitting: Physician Assistant

## 2022-01-07 ENCOUNTER — Other Ambulatory Visit: Payer: Self-pay | Admitting: Cardiology

## 2022-01-23 ENCOUNTER — Encounter: Payer: Self-pay | Admitting: Cardiology

## 2022-01-23 ENCOUNTER — Ambulatory Visit: Payer: Medicare PPO | Admitting: Cardiology

## 2022-01-23 VITALS — BP 140/70 | HR 42 | Ht 70.0 in | Wt 219.0 lb

## 2022-01-23 DIAGNOSIS — I493 Ventricular premature depolarization: Secondary | ICD-10-CM

## 2022-01-23 NOTE — Progress Notes (Signed)
Electrophysiology Office Note   Date:  01/23/2022   ID:  Erik Murray, DOB Oct 11, 1945, MRN [id number]  PCP:  Asencion Noble, MD  Cardiologist:  Tamala Julian Primary Electrophysiologist:  [place] Timothy Meredith Leeds, MD    Chief Complaint: PVC   History of Present Illness: Erik Murray is a 76 y.o. male who is being seen today for the evaluation of PVC at the request of Asencion Noble, MD. Presenting today for electrophysiology evaluation.  He has a history of PVCs.  He presented emergency room for right thumb laceration was found to have a low heart rate.  ECG shows PVCs.  He wore a cardiac monitor that showed a significant burden of PVCs.  He was unaware of his arrhythmia.  He has been started on flecainide.  Today, denies symptoms of palpitations, chest pain, shortness of breath, orthopnea, PND, lower extremity edema, claudication, dizziness, presyncope, syncope, bleeding, or neurologic sequela. The patient is tolerating medications without difficulties.  Since being started on the flecainide he has much less fatigue and weakness.  He is able to do all of his daily activities.  He is gone back to exercising, and is able to walk much further with suppression of his PVCs.   Past Medical History:  Diagnosis Date   Arthritis    Cancer ([place]) 2016   prostate   Hx of adenomatous colonic polyps    Past Surgical History:  Procedure Laterality Date   COLONOSCOPY  02/05/2012   [id number] and rectal polyps/few diverticulosis (adenomas). next TCS 01/2015   COLONOSCOPY N/A 02/10/2015   Procedure: COLONOSCOPY;  Surgeon: Daneil Dolin, MD;  [facility];  Service: Endoscopy;  Laterality: N/A;   COLONOSCOPY     COLONOSCOPY N/A 05/15/2018   Procedure: COLONOSCOPY;  Surgeon: Daneil Dolin, MD;  [facility];  Service: Endoscopy;  Laterality: N/A;  7:30   EYE SURGERY Bilateral apirl, may 2013   cataract lens replacement   LYMPHADENECTOMY Bilateral 10/01/2014   Procedure:  LYMPHADENECTOMY;  Surgeon: Raynelle Bring, MD;  [facility];  Service: Urology;  Laterality: Bilateral;   POLYPECTOMY  05/15/2018   Procedure: POLYPECTOMY;  Surgeon: Daneil Dolin, MD;  [facility];  Service: Endoscopy;;  recto-sigmoid colon x2; splenic flexure(cs);   PROSTATE BIOPSY  dec 2015   ROBOT ASSISTED LAPAROSCOPIC RADICAL PROSTATECTOMY N/A 10/01/2014   Procedure: ROBOTIC ASSISTED LAPAROSCOPIC RADICAL PROSTATECTOMY LEVEL 2;  Surgeon: Raynelle Bring, MD;  [facility];  Service: Urology;  Laterality: N/A;   TONSILLECTOMY  as child   TRIGGER FINGER RELEASE  10/10/2011   Procedure: RELEASE TRIGGER FINGER/A-1 PULLEY;  Surgeon: Wynonia Sours, MD;  Location: [place];  Service: Orthopedics;  Laterality: Right;  right thumb   TRIGGER FINGER RELEASE Right 05/23/2016   Procedure: RELEASE TRIGGER FINGER/A-1 PULLEY right index finger;  Surgeon: Daryll Brod, MD;  Location: [place];  Service: Orthopedics;  Laterality: Right;  FAB   TRIGGER FINGER RELEASE Left 07/18/2016   Procedure: RELEASE TRIGGER FINGER/A-1 PULLEY, left index;  Surgeon: Daryll Brod, MD;  Location: [place];  Service: Orthopedics;  Laterality: Left;  FAB   TRIGGER FINGER RELEASE Left 03/29/2021   Procedure: RELEASE TRIGGER FINGER/A-1 PULLEY LEFT MIDDLE FINGER;  Surgeon: Daryll Brod, MD;  Location: [place];  Service: Orthopedics;  Laterality: Left;   YAG LASER APPLICATION Right 10/12/6008   Procedure: YAG LASER APPLICATION;  Surgeon: Rutherford Guys, MD;  [facility];  Service: Ophthalmology;  Laterality: Right;   YAG LASER APPLICATION Left 2/70/6237   Procedure: YAG LASER APPLICATION;  Surgeon: Rutherford Guys, MD;  [facility];  Service: Ophthalmology;  Laterality: Left;     Current Outpatient Medications  Medication Sig Dispense Refill   flecainide (TAMBOCOR) 100 MG tablet TAKE 1 TABLET(100 MG) BY MOUTH TWICE DAILY 60 tablet 3   metoprolol  tartrate (LOPRESSOR) 25 MG tablet Take 0.5 tablets (12.5 mg total) by mouth 2 (two) times daily. 90 tablet 3   multivitamin (ONE-A-DAY MEN'S) TABS tablet Take 1 tablet by mouth daily.     No current facility-administered medications for this visit.    Allergies:   Patient has no known allergies.   Social History:  The patient  reports that he has quit smoking. His smoking use included cigarettes. He has a 40.00 pack-year smoking history. He quit smokeless tobacco use about 16 years ago. He reports current alcohol use of about 6.0 standard drinks of alcohol per week. He reports that he does not use drugs.   Family History:  The patient's family history includes Diabetes in his mother; Heart disease in his mother; Other in his father.   ROS:  Please see the history of present illness.   Otherwise, review of systems is positive for none.   All other systems are reviewed and negative.   PHYSICAL EXAM: VS:  BP 140/70 (BP Location: Left Arm, Patient Position: Sitting, Cuff Size: Normal)   Pulse (!) 42   Ht '5\' 10"'$  (1.778 m)   Wt 219 lb (99.3 kg)   BMI 31.42 kg/m  , BMI Body mass index is 31.42 kg/m. GEN: Well nourished, well developed, in no acute distress  HEENT: normal  Neck: no JVD, carotid bruits, or masses Cardiac: RRR; no murmurs, rubs, or gallops,no edema  Respiratory:  clear to auscultation bilaterally, normal work of breathing GI: soft, nontender, nondistended, + BS MS: no deformity or atrophy  Skin: warm and dry Neuro:  Strength and sensation are intact Psych: euthymic mood, full affect  EKG:  EKG is ordered today. Personal review of the ekg ordered shows sinus rhythm, rate 42  Recent Labs: 07/24/2021: B Natriuretic Peptide 203.8; Hemoglobin 16.3; Platelets 145; TSH 1.708 10/28/2021: BUN 20; Creatinine, Ser 0.97; Magnesium 2.2; Potassium 4.4; Sodium 142    Lipid Panel  No results found for: "CHOL", "TRIG", "HDL", "CHOLHDL", "VLDL", "LDLCALC", "LDLDIRECT"   Wt Readings  from Last 3 Encounters:  01/23/22 219 lb (99.3 kg)  10/28/21 216 lb (98 kg)  09/08/21 214 lb 9.6 oz (97.3 kg)      Other studies Reviewed: Additional studies/ records that were reviewed today include: TTE 07/25/21  Review of the above records today demonstrates:   1. Challenging assessment of wall motion/EF with frequent ventricular  ectopy. Left ventricular ejection fraction, by estimation, is 55 to 60%.  The left ventricle has normal function. The left ventricle has no regional  wall motion abnormalities. There is   mild left ventricular hypertrophy. Left ventricular diastolic parameters  are indeterminate.   2. Right ventricular systolic function is normal. The right ventricular  size is normal. Tricuspid regurgitation signal is inadequate for assessing  PA pressure.   3. The mitral valve is grossly normal. No evidence of mitral valve  regurgitation.   4. Focal calcification. Aortic valve regurgitation is not visualized.   5. The inferior vena cava is normal in size with greater than 50%  respiratory variability, suggesting right atrial pressure of 3 mmHg.   Cardiac monitor  11/08/2021 personally reviewed Predominant rhythm was sinus rhythm <1% supraventricular ectopy 6.1% ventricular ectopy No triggered episodes noted  ASSESSMENT AND PLAN:  1.  PVCs: Currently on flecainide 100 mg twice daily, Toprol-XL 12.5 mg twice daily.  High risk medication monitoring for flecainide.  QRS remains narrow.  He feels much improved with quite a bit less fatigue and weakness.  He is overall happy with his control.  We Sandford Diop continue with current management.  Current medicines are reviewed at length with the patient today.   The patient does not have concerns regarding his medicines.  The following changes were made today: none  Labs/ tests ordered today include:  Orders Placed This Encounter  Procedures   EKG 12-Lead     Disposition:   FU 6 months  Signed, [place] Meredith Leeds, MD   01/23/2022 10:37 AM     [facility] [address] [place] [address] [phone] (office) [phone] (fax)

## 2022-03-13 DIAGNOSIS — Z79899 Other long term (current) drug therapy: Secondary | ICD-10-CM | POA: Diagnosis not present

## 2022-03-13 DIAGNOSIS — E785 Hyperlipidemia, unspecified: Secondary | ICD-10-CM | POA: Diagnosis not present

## 2022-03-13 DIAGNOSIS — R7301 Impaired fasting glucose: Secondary | ICD-10-CM | POA: Diagnosis not present

## 2022-03-13 DIAGNOSIS — Z8546 Personal history of malignant neoplasm of prostate: Secondary | ICD-10-CM | POA: Diagnosis not present

## 2022-04-24 DIAGNOSIS — R7309 Other abnormal glucose: Secondary | ICD-10-CM | POA: Diagnosis not present

## 2022-04-24 DIAGNOSIS — I493 Ventricular premature depolarization: Secondary | ICD-10-CM | POA: Diagnosis not present

## 2022-04-24 DIAGNOSIS — E789 Disorder of lipoprotein metabolism, unspecified: Secondary | ICD-10-CM | POA: Diagnosis not present

## 2022-04-24 DIAGNOSIS — D696 Thrombocytopenia, unspecified: Secondary | ICD-10-CM | POA: Diagnosis not present

## 2022-04-24 DIAGNOSIS — Z8546 Personal history of malignant neoplasm of prostate: Secondary | ICD-10-CM | POA: Diagnosis not present

## 2022-04-24 DIAGNOSIS — Z23 Encounter for immunization: Secondary | ICD-10-CM | POA: Diagnosis not present

## 2022-04-26 DIAGNOSIS — C61 Malignant neoplasm of prostate: Secondary | ICD-10-CM | POA: Diagnosis not present

## 2022-04-26 DIAGNOSIS — N5201 Erectile dysfunction due to arterial insufficiency: Secondary | ICD-10-CM | POA: Diagnosis not present

## 2022-05-10 ENCOUNTER — Other Ambulatory Visit: Payer: Self-pay | Admitting: Cardiology

## 2022-07-27 NOTE — Progress Notes (Signed)
PCP:  Asencion Noble, MD Primary Cardiologist: None Electrophysiologist: Will Meredith Leeds, MD   Erik Murray is a 76 y.o. male seen today for Will Meredith Leeds, MD for routine electrophysiology followup. Since last being seen in our clinic the patient reports doing very well.  he denies chest pain, palpitations, dyspnea, PND, orthopnea, nausea, vomiting, dizziness, syncope, edema, weight gain, or early satiety.   Past Medical History:  Diagnosis Date   Arthritis    Cancer ([place]) 2016   prostate   Hx of adenomatous colonic polyps    Past Surgical History:  Procedure Laterality Date   COLONOSCOPY  02/05/2012   [id number] and rectal polyps/few diverticulosis (adenomas). next TCS 01/2015   COLONOSCOPY N/A 02/10/2015   Procedure: COLONOSCOPY;  Surgeon: Daneil Dolin, MD;  [facility];  Service: Endoscopy;  Laterality: N/A;   COLONOSCOPY     COLONOSCOPY N/A 05/15/2018   Procedure: COLONOSCOPY;  Surgeon: Daneil Dolin, MD;  [facility];  Service: Endoscopy;  Laterality: N/A;  7:30   EYE SURGERY Bilateral apirl, may 2013   cataract lens replacement   LYMPHADENECTOMY Bilateral 10/01/2014   Procedure: LYMPHADENECTOMY;  Surgeon: Raynelle Bring, MD;  [facility];  Service: Urology;  Laterality: Bilateral;   POLYPECTOMY  05/15/2018   Procedure: POLYPECTOMY;  Surgeon: Daneil Dolin, MD;  [facility];  Service: Endoscopy;;  recto-sigmoid colon x2; splenic flexure(cs);   PROSTATE BIOPSY  dec 2015   ROBOT ASSISTED LAPAROSCOPIC RADICAL PROSTATECTOMY N/A 10/01/2014   Procedure: ROBOTIC ASSISTED LAPAROSCOPIC RADICAL PROSTATECTOMY LEVEL 2;  Surgeon: Raynelle Bring, MD;  [facility];  Service: Urology;  Laterality: N/A;   TONSILLECTOMY  as child   TRIGGER FINGER RELEASE  10/10/2011   Procedure: RELEASE TRIGGER FINGER/A-1 PULLEY;  Surgeon: Wynonia Sours, MD;  Location: [place];  Service: Orthopedics;  Laterality: Right;  right thumb    TRIGGER FINGER RELEASE Right 05/23/2016   Procedure: RELEASE TRIGGER FINGER/A-1 PULLEY right index finger;  Surgeon: Daryll Brod, MD;  Location: [place];  Service: Orthopedics;  Laterality: Right;  FAB   TRIGGER FINGER RELEASE Left 07/18/2016   Procedure: RELEASE TRIGGER FINGER/A-1 PULLEY, left index;  Surgeon: Daryll Brod, MD;  Location: [place];  Service: Orthopedics;  Laterality: Left;  FAB   TRIGGER FINGER RELEASE Left 03/29/2021   Procedure: RELEASE TRIGGER FINGER/A-1 PULLEY LEFT MIDDLE FINGER;  Surgeon: Daryll Brod, MD;  Location: [place];  Service: Orthopedics;  Laterality: Left;   YAG LASER APPLICATION Right 02/10/4764   Procedure: YAG LASER APPLICATION;  Surgeon: Rutherford Guys, MD;  [facility];  Service: Ophthalmology;  Laterality: Right;   YAG LASER APPLICATION Left 4/65/0354   Procedure: YAG LASER APPLICATION;  Surgeon: Rutherford Guys, MD;  [facility];  Service: Ophthalmology;  Laterality: Left;    Current Outpatient Medications  Medication Sig Dispense Refill   flecainide (TAMBOCOR) 100 MG tablet TAKE 1 TABLET(100 MG) BY MOUTH TWICE DAILY 60 tablet 8   metoprolol tartrate (LOPRESSOR) 25 MG tablet Take 0.5 tablets (12.5 mg total) by mouth 2 (two) times daily. 90 tablet 3   multivitamin (ONE-A-DAY MEN'S) TABS tablet Take 1 tablet by mouth daily.     No current facility-administered medications for this visit.    No Known Allergies  Social History   Socioeconomic History   Marital status: Married    Spouse name: Not on file   Number of children: 2   Years  of education: Not on file   Highest education level: Not on file  Occupational History   Not on file  Tobacco Use   Smoking status: Former    Packs/day: 1.00    Years: 40.00    Total pack years: 40.00    Types: Cigarettes   Smokeless tobacco: Former    Quit date: 10/03/2005  Vaping Use   Vaping Use: Never used  Substance and Sexual Activity   Alcohol use:  Yes    Alcohol/week: 6.0 standard drinks of alcohol    Types: 6 Standard drinks or equivalent per week    Comment: occasional   Drug use: No   Sexual activity: Not on file  Other Topics Concern   Not on file  Social History Narrative   Not on file   Social Determinants of Health   Financial Resource Strain: Not on file  Food Insecurity: Not on file  Transportation Needs: Not on file  Physical Activity: Not on file  Stress: Not on file  Social Connections: Not on file  Intimate Partner Violence: Not on file     Review of Systems: All other systems reviewed and are otherwise negative except as noted above.  Physical Exam: Vitals:   07/31/22 0831  BP: 136/74  Pulse: (!) 57  SpO2: 95%  Weight: 223 lb 12.8 oz (101.5 kg)  Height: '5\' 10"'$  (1.778 m)    GEN- The patient is well appearing, alert and oriented x 3 today.   HEENT: normocephalic, atraumatic; sclera clear, conjunctiva pink; hearing intact; oropharynx clear; neck supple, no JVP Lymph- no cervical lymphadenopathy Lungs- Clear to ausculation bilaterally, normal work of breathing.  No wheezes, rales, rhonchi Heart- Regular rate and rhythm, no murmurs, rubs or gallops, PMI not laterally displaced GI- soft, non-tender, non-distended, bowel sounds present, no hepatosplenomegaly Extremities- No peripheral edema. no clubbing or cyanosis; DP/PT/radial pulses 2+ bilaterally MS- no significant deformity or atrophy Skin- warm and dry, no rash or lesion Psych- euthymic mood, full affect Neuro- strength and sensation are intact  EKG is ordered. Personal review of EKG from today shows sinus bradycardia at 57 bpm, intervals stable from prior  Additional studies reviewed include: Previous EP notes.    Assessment and Plan:  1. PVCs EKG today shows NSR/sinus bradycardia with stable intervals Continue flecainide 100 mg BID Continue Toprol 12.5 mg BID   Follow up with Dr. Curt Bears in 6 months  Shirley Friar, PA-C   07/31/22 8:49 AM

## 2022-07-31 ENCOUNTER — Encounter: Payer: Self-pay | Admitting: Student

## 2022-07-31 ENCOUNTER — Ambulatory Visit: Payer: Medicare PPO | Attending: Student | Admitting: Student

## 2022-07-31 VITALS — BP 136/74 | HR 57 | Ht 70.0 in | Wt 223.8 lb

## 2022-07-31 DIAGNOSIS — I493 Ventricular premature depolarization: Secondary | ICD-10-CM | POA: Diagnosis not present

## 2022-07-31 MED ORDER — METOPROLOL TARTRATE 25 MG PO TABS: 12.5000 mg | ORAL_TABLET | Freq: Two times a day (BID) | ORAL | 3 refills | Status: DC

## 2022-07-31 MED ORDER — FLECAINIDE ACETATE 100 MG PO TABS: ORAL_TABLET | ORAL | 3 refills | Status: DC

## 2022-07-31 NOTE — Patient Instructions (Signed)
Medication Instructions:  Your physician recommends that you continue on your current medications as directed. Please refer to the Current Medication list given to you today.  *If you need a refill on your cardiac medications before your next appointment, please call your pharmacy*   Lab Work: None If you have labs (blood work) drawn today and your tests are completely normal, you will receive your results only by: [place] (if you have MyChart) OR A paper copy in the mail If you have any lab test that is abnormal or we need to change your treatment, we will call you to review the results.   Follow-Up: At [facility], you and your health needs are our priority.  As part of our continuing mission to provide you with exceptional heart care, we have created designated Provider Care Teams.  These Care Teams include your primary Cardiologist (physician) and Advanced Practice Providers (APPs -  Physician Assistants and Nurse Practitioners) who all work together to provide you with the care you need, when you need it.   Your next appointment:   6 month(s)  The format for your next appointment:   In Person  Provider:   Lars Mage, MD     Important Information About Sugar

## 2022-09-01 IMAGING — DX DG CHEST 1V PORT
1 series · 1 of 1 positions shown · non-contrast
Comparison: None.

CLINICAL DATA: Pt states he cut his his thumb on a dirty knife.
Reason for exam " bradycardia"bradycardia

EXAM:
PORTABLE CHEST 1 VIEW

[chest]
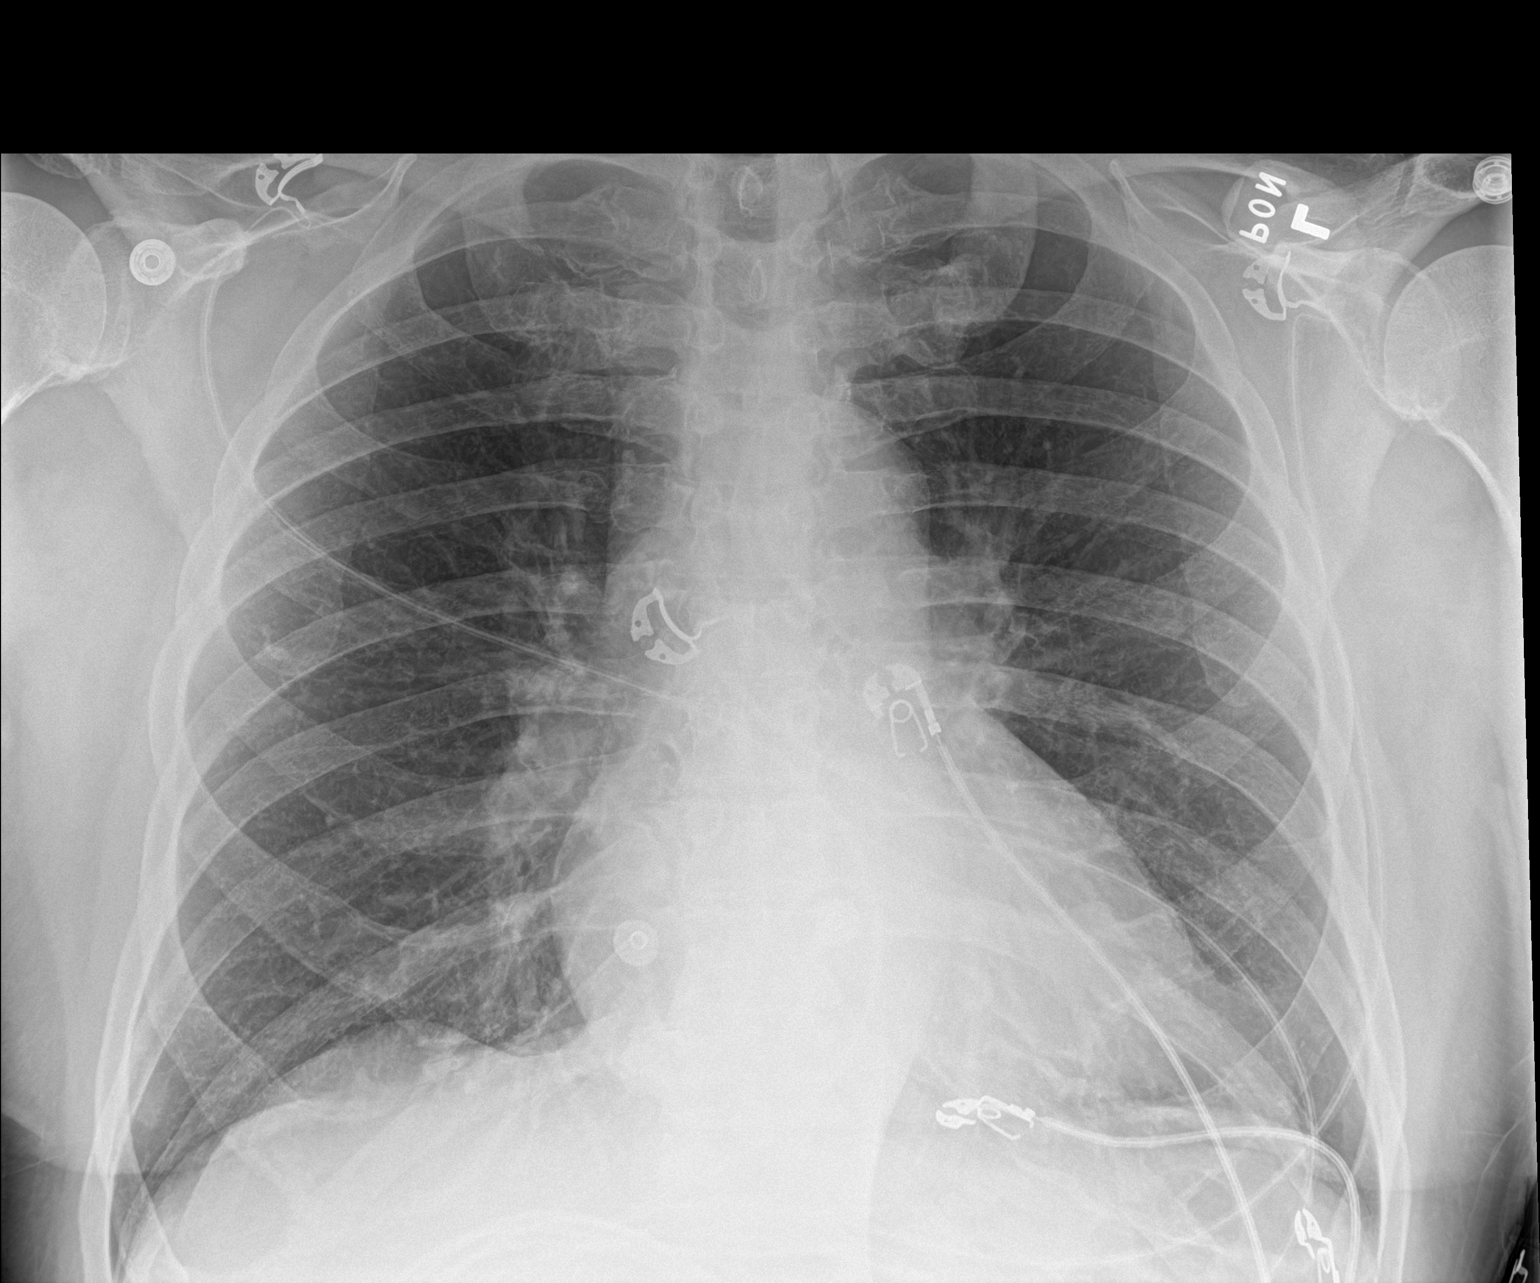

[1 of 1 positions shown; findings below may reference images not displayed]

FINDINGS: The heart size and mediastinal contours are within normal limits.
Both lungs are clear. The visualized skeletal structures are
unremarkable.
IMPRESSION: No acute cardiopulmonary process.

## 2022-09-02 ENCOUNTER — Ambulatory Visit
Admission: RE | Admit: 2022-09-02 | Discharge: 2022-09-02 | Disposition: A | Discharge status: Home / Self Care | Payer: Medicare PPO | Source: Ambulatory Visit | Attending: Emergency Medicine | Admitting: Emergency Medicine

## 2022-09-02 VITALS — BP 143/71 | HR 60 | Temp 97.9°F | Resp 18

## 2022-09-02 DIAGNOSIS — B029 Zoster without complications: Secondary | ICD-10-CM

## 2022-09-02 MED ORDER — VALACYCLOVIR HCL 1 G PO TABS: 1000.0000 mg | ORAL_TABLET | Freq: Three times a day (TID) | ORAL | 0 refills | Status: DC

## 2022-09-02 MED ORDER — PREDNISONE 10 MG PO TABS: 20.0000 mg | ORAL_TABLET | Freq: Every day | ORAL | 0 refills | Status: AC

## 2022-09-02 NOTE — Discharge Instructions (Addendum)
Rest and use ice/heat as needed for symptomatic relief Prescribed valacyclovir '1000mg'$  3x/day for 7 days Prescribed prednisone  for inflammation and pain Use OTC medications such as ibuprofen/ tylenol.   Follow up with PCP if symptoms of burning, stinging, tingling or numbness occur after rash resolves, you may need additional treatment Return here or go to ER if you have any new or worsening symptoms (such as eye involvement, severe pain, or signs of secondary infection such as fever, chills, nausea, vomiting, discharge, redness or warmth over site of rash)

## 2022-09-02 NOTE — ED Provider Notes (Signed)
[place]   [id number] 09/02/22 Arrival Time: [phone]   Chief Complaint  Patient presents with   Rash    Looks like Shingles - Entered by patient     SUBJECTIVE: History from: patient.  Erik Murray is a 77 y.o. male w who presented to the urgent care with for complaint of burning painful rash on the left part of his chest for the past 1 day.  Denies any precipitating event.  Is unsure of any OTC medication.  Denies any aggravating factors.  Denies previous symptoms in the past.   Denies fever, chills, fatigue.  ROS: As per HPI.  All other pertinent ROS negative.      Past Medical History:  Diagnosis Date   Arthritis    Cancer ([place]) 2016   prostate   Hx of adenomatous colonic polyps    Past Surgical History:  Procedure Laterality Date   COLONOSCOPY  02/05/2012   [id number] and rectal polyps/few diverticulosis (adenomas). next TCS 01/2015   COLONOSCOPY N/A 02/10/2015   Procedure: COLONOSCOPY;  Surgeon: Daneil Dolin, MD;  [facility];  Service: Endoscopy;  Laterality: N/A;   COLONOSCOPY     COLONOSCOPY N/A 05/15/2018   Procedure: COLONOSCOPY;  Surgeon: Daneil Dolin, MD;  [facility];  Service: Endoscopy;  Laterality: N/A;  7:30   EYE SURGERY Bilateral apirl, may 2013   cataract lens replacement   LYMPHADENECTOMY Bilateral 10/01/2014   Procedure: LYMPHADENECTOMY;  Surgeon: Raynelle Bring, MD;  [facility];  Service: Urology;  Laterality: Bilateral;   POLYPECTOMY  05/15/2018   Procedure: POLYPECTOMY;  Surgeon: Daneil Dolin, MD;  [facility];  Service: Endoscopy;;  recto-sigmoid colon x2; splenic flexure(cs);   PROSTATE BIOPSY  dec 2015   ROBOT ASSISTED LAPAROSCOPIC RADICAL PROSTATECTOMY N/A 10/01/2014   Procedure: ROBOTIC ASSISTED LAPAROSCOPIC RADICAL PROSTATECTOMY LEVEL 2;  Surgeon: Raynelle Bring, MD;  [facility];  Service: Urology;  Laterality: N/A;   TONSILLECTOMY  as child   TRIGGER FINGER RELEASE   10/10/2011   Procedure: RELEASE TRIGGER FINGER/A-1 PULLEY;  Surgeon: Wynonia Sours, MD;  Location: [place];  Service: Orthopedics;  Laterality: Right;  right thumb   TRIGGER FINGER RELEASE Right 05/23/2016   Procedure: RELEASE TRIGGER FINGER/A-1 PULLEY right index finger;  Surgeon: Daryll Brod, MD;  Location: [place];  Service: Orthopedics;  Laterality: Right;  FAB   TRIGGER FINGER RELEASE Left 07/18/2016   Procedure: RELEASE TRIGGER FINGER/A-1 PULLEY, left index;  Surgeon: Daryll Brod, MD;  Location: [place];  Service: Orthopedics;  Laterality: Left;  FAB   TRIGGER FINGER RELEASE Left 03/29/2021   Procedure: RELEASE TRIGGER FINGER/A-1 PULLEY LEFT MIDDLE FINGER;  Surgeon: Daryll Brod, MD;  Location: [place];  Service: Orthopedics;  Laterality: Left;   YAG LASER APPLICATION Right 5/39/7673   Procedure: YAG LASER APPLICATION;  Surgeon: Rutherford Guys, MD;  [facility];  Service: Ophthalmology;  Laterality: Right;   YAG LASER APPLICATION Left 11/30/3788   Procedure: YAG LASER APPLICATION;  Surgeon: Rutherford Guys, MD;  [facility];  Service: Ophthalmology;  Laterality: Left;   No Known Allergies No current facility-administered medications on file prior to encounter.   Current Outpatient Medications on File Prior to Encounter  Medication Sig Dispense Refill   flecainide (TAMBOCOR) 100 MG tablet TAKE 1 TABLET(100 MG) BY MOUTH TWICE DAILY 180 tablet 3   metoprolol tartrate (LOPRESSOR) 25 MG tablet Take 0.5 tablets (12.5 mg  total) by mouth 2 (two) times daily. 90 tablet 3   multivitamin (ONE-A-DAY MEN'S) TABS tablet Take 1 tablet by mouth daily.     Social History   Socioeconomic History   Marital status: Married    Spouse name: Not on file   Number of children: 2   Years of education: Not on file   Highest education level: Not on file  Occupational History   Not on file  Tobacco Use   Smoking status: Former     Packs/day: 1.00    Years: 40.00    Total pack years: 40.00    Types: Cigarettes   Smokeless tobacco: Former    Quit date: 10/03/2005  Vaping Use   Vaping Use: Never used  Substance and Sexual Activity   Alcohol use: Yes    Alcohol/week: 6.0 standard drinks of alcohol    Types: 6 Standard drinks or equivalent per week    Comment: occasional   Drug use: No   Sexual activity: Not on file  Other Topics Concern   Not on file  Social History Narrative   Not on file   Social Determinants of Health   Financial Resource Strain: Not on file  Food Insecurity: Not on file  Transportation Needs: Not on file  Physical Activity: Not on file  Stress: Not on file  Social Connections: Not on file  Intimate Partner Violence: Not on file   Family History  Problem Relation Age of Onset   Diabetes Mother    Heart disease Mother    Other Father        age 57, deceased form of lupus   Colon cancer Neg Hx     OBJECTIVE:  Vitals:   09/02/22 0947  BP: (!) 143/71  Pulse: 60  Resp: 18  Temp: 97.9 F (36.6 C)  TempSrc: Oral  SpO2: 93%     Physical Exam Vitals and nursing note reviewed.  Constitutional:      General: He is not in acute distress.    Appearance: Normal appearance. He is normal weight. He is not ill-appearing, toxic-appearing or diaphoretic.  Cardiovascular:     Rate and Rhythm: Normal rate and regular rhythm.     Pulses: Normal pulses.     Heart sounds: Normal heart sounds. No murmur heard.    No friction rub. No gallop.  Pulmonary:     Effort: Pulmonary effort is normal. No respiratory distress.     Breath sounds: Normal breath sounds. No stridor. No wheezing, rhonchi or rales.  Chest:     Chest wall: No tenderness.  Skin:    Findings: Rash present. Rash is vesicular.     Comments: Vesical rash around chest  Neurological:     Mental Status: He is alert and oriented to person, place, and time.      LABS:  No results found for this or any previous visit  (from the past 24 hour(s)).   ASSESSMENT & PLAN:  1. Herpes zoster without complication     Meds ordered this encounter  Medications   valACYclovir (VALTREX) 1000 MG tablet    Sig: Take 1 tablet (1,000 mg total) by mouth 3 (three) times daily.    Dispense:  21 tablet    Refill:  0   predniSONE (DELTASONE) 10 MG tablet    Sig: Take 2 tablets (20 mg total) by mouth daily for 5 days.    Dispense:  10 tablet    Refill:  0    Discharge instructions  Rest and use ice/heat as needed for symptomatic relief Prescribed valacyclovir '1000mg'$  3x/day for 7 days Prescribed prednisone  for inflammation and pain Use OTC medications such as ibuprofen/ tylenol.   Follow up with PCP if symptoms of burning, stinging, tingling or numbness occur after rash resolves, you may need additional treatment Return here or go to ER if you have any new or worsening symptoms (such as eye involvement, severe pain, or signs of secondary infection such as fever, chills, nausea, vomiting, discharge, redness or warmth over site of rash)    reviewed expectations re: course of current medical issues. Questions answered. Outlined signs and symptoms indicating need for more acute intervention. Patient verbalized understanding. After Visit Summary given.          Emerson Monte, FNP 09/02/22 1024

## 2022-09-02 NOTE — ED Triage Notes (Signed)
Pt reports he has a burning and painful rash in the chest that looks like shingles x 1 day.

## 2022-11-23 DIAGNOSIS — Z961 Presence of intraocular lens: Secondary | ICD-10-CM | POA: Diagnosis not present

## 2022-11-23 DIAGNOSIS — H524 Presbyopia: Secondary | ICD-10-CM | POA: Diagnosis not present

## 2022-12-20 ENCOUNTER — Other Ambulatory Visit: Payer: Self-pay | Admitting: Cardiology

## 2023-02-02 ENCOUNTER — Ambulatory Visit: Payer: Medicare PPO | Admitting: Cardiology

## 2023-03-19 ENCOUNTER — Encounter: Payer: Self-pay | Admitting: Student

## 2023-03-19 ENCOUNTER — Ambulatory Visit: Payer: Medicare PPO | Admitting: Student

## 2023-03-19 VITALS — BP 124/82 | HR 51 | Ht 70.0 in | Wt 221.0 lb

## 2023-03-19 DIAGNOSIS — I493 Ventricular premature depolarization: Secondary | ICD-10-CM | POA: Diagnosis not present

## 2023-03-19 NOTE — Patient Instructions (Signed)
Medication Instructions:  Your physician recommends that you continue on your current medications as directed. Please refer to the Current Medication list given to you today.  *If you need a refill on your cardiac medications before your next appointment, please call your pharmacy*  Lab Work: None ordered today.  Testing/Procedures: None ordered today.  Follow-Up: At [facility], you and your health needs are our priority.  As part of our continuing mission to provide you with exceptional heart care, we have created designated Provider Care Teams.  These Care Teams include your primary Cardiologist (physician) and Advanced Practice Providers (APPs -  Physician Assistants and Nurse Practitioners) who all work together to provide you with the care you need, when you need it.  Your next appointment:   6 month(s)  The format for your next appointment:   In Person  Provider:   Loman Brooklyn, MD{

## 2023-03-19 NOTE — Progress Notes (Signed)
  Electrophysiology Office Note:   Date:  03/19/2023  ID:  Erik Murray, DOB 03/26/1946, MRN [id number]  Primary Cardiologist: None Electrophysiologist: Will Jorja Loa, MD      History of Present Illness:   Erik Murray is a 77 y.o. male with h/o PVCs and arthritis seen today for routine electrophysiology followup.  Since last being seen in our clinic the patient reports doing very well.  he denies chest pain, palpitations, dyspnea, PND, orthopnea, nausea, vomiting, dizziness, syncope, edema, weight gain, or early satiety.   Review of systems complete and found to be negative unless listed in HPI.   EP Information / Studies Reviewed:    EKG is ordered today. Personal review as below.  EKG Interpretation Date/Time:  Monday March 19 2023 09:52:16 EDT Ventricular Rate:  51 PR Interval:  196 QRS Duration:  104 QT Interval:  438 QTC Calculation: 403 R Axis:   61  Text Interpretation: Sinus bradycardia Confirmed by Maxine Glenn [id number]) on 03/19/2023 10:16:38 AM       Physical Exam:   VS:  BP 124/82   Pulse (!) 51   Ht 5\' 10"  (1.778 m)   Wt 221 lb (100.2 kg)   BMI 31.71 kg/m    Wt Readings from Last 3 Encounters:  03/19/23 221 lb (100.2 kg)  07/31/22 223 lb 12.8 oz (101.5 kg)  01/23/22 219 lb (99.3 kg)     GEN: Well nourished, well developed in no acute distress NECK: No JVD; No carotid bruits CARDIAC: Regular rate and rhythm, no murmurs, rubs, gallops RESPIRATORY:  Clear to auscultation without rales, wheezing or rhonchi  ABDOMEN: Soft, non-tender, non-distended EXTREMITIES:  No edema; No deformity   ASSESSMENT AND PLAN:    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 Elberta Fortis in 6 months  Signed, Graciella Freer, PA-C

## 2023-04-18 DIAGNOSIS — C61 Malignant neoplasm of prostate: Secondary | ICD-10-CM | POA: Diagnosis not present

## 2023-04-24 DIAGNOSIS — C61 Malignant neoplasm of prostate: Secondary | ICD-10-CM | POA: Diagnosis not present

## 2023-04-24 DIAGNOSIS — M5186 Other intervertebral disc disorders, lumbar region: Secondary | ICD-10-CM | POA: Diagnosis not present

## 2023-04-24 DIAGNOSIS — R7301 Impaired fasting glucose: Secondary | ICD-10-CM | POA: Diagnosis not present

## 2023-04-24 DIAGNOSIS — D696 Thrombocytopenia, unspecified: Secondary | ICD-10-CM | POA: Diagnosis not present

## 2023-04-25 DIAGNOSIS — C61 Malignant neoplasm of prostate: Secondary | ICD-10-CM | POA: Diagnosis not present

## 2023-04-30 DIAGNOSIS — D696 Thrombocytopenia, unspecified: Secondary | ICD-10-CM | POA: Diagnosis not present

## 2023-04-30 DIAGNOSIS — Z0001 Encounter for general adult medical examination with abnormal findings: Secondary | ICD-10-CM | POA: Diagnosis not present

## 2023-04-30 DIAGNOSIS — E789 Disorder of lipoprotein metabolism, unspecified: Secondary | ICD-10-CM | POA: Diagnosis not present

## 2023-04-30 DIAGNOSIS — Z23 Encounter for immunization: Secondary | ICD-10-CM | POA: Diagnosis not present

## 2023-04-30 DIAGNOSIS — R7309 Other abnormal glucose: Secondary | ICD-10-CM | POA: Diagnosis not present

## 2023-05-02 ENCOUNTER — Encounter: Payer: Self-pay | Admitting: *Deleted

## 2023-05-11 ENCOUNTER — Encounter: Payer: Self-pay | Admitting: Gastroenterology

## 2023-05-11 ENCOUNTER — Encounter: Payer: Self-pay | Admitting: *Deleted

## 2023-05-11 ENCOUNTER — Ambulatory Visit: Payer: Medicare PPO | Admitting: Gastroenterology

## 2023-05-11 VITALS — BP 147/81 | HR 52 | Temp 98.4°F | Ht 70.0 in | Wt 227.6 lb

## 2023-05-11 DIAGNOSIS — Z1211 Encounter for screening for malignant neoplasm of colon: Secondary | ICD-10-CM | POA: Diagnosis not present

## 2023-05-11 DIAGNOSIS — Z86010 Personal history of colonic polyps: Secondary | ICD-10-CM | POA: Diagnosis not present

## 2023-05-11 NOTE — Progress Notes (Signed)
GI Office Note    Referring Provider: Carylon Perches, MD Primary Care Physician:  Carylon Perches, MD  Primary Gastroenterologist: Roetta Sessions, MD   Chief Complaint   Chief Complaint  Patient presents with   Colonoscopy     History of Present Illness   Erik Murray is a 77 y.o. male presenting today to schedule surveillance colonoscopy.  Feeling well. BMs regular. No melena, brbpr. No abdominal pain. No UGI symptoms.   He was diagnosed with frequent PVCs and bradycardia in late 2022, incidentally found while on heart monitor in urgent care for thumb laceration. He has been asymptomatic. Treated with metoprolol and flecainide, last seen in 03/2023.  Labs 04/2023: White blood cell count 7600, hemoglobin 16.6, hematocrit 47.4, platelets 149,000, glucose 108, creatinine 1.18, albumin 4.6, total bilirubin 0.9, alk phos 69, AST 22, ALT 17  Colonoscopy 05/2018: -three 4-12mm polyps recto-sigmoid colon and splenic flexure -diverticulosis -benign pathology -consider colonoscopy in five years if health permits  Medications   Current Outpatient Medications  Medication Sig Dispense Refill   flecainide (TAMBOCOR) 100 MG tablet TAKE 1 TABLET(100 MG) BY MOUTH TWICE DAILY 180 tablet 3   metoprolol tartrate (LOPRESSOR) 25 MG tablet Take 0.5 tablets (12.5 mg total) by mouth 2 (two) times daily. 90 tablet 3   No current facility-administered medications for this visit.    Allergies   Allergies as of 05/11/2023   (No Known Allergies)    Past Medical History   Past Medical History:  Diagnosis Date   Arthritis    Cancer ([facility]) 2016   prostate   Hx of adenomatous colonic polyps    PVC (premature ventricular contraction)     Past Surgical History   Past Surgical History:  Procedure Laterality Date   COLONOSCOPY  02/05/2012   [id number] and rectal polyps/few diverticulosis (adenomas). next TCS 01/2015   COLONOSCOPY N/A 02/10/2015   Procedure: COLONOSCOPY;  Surgeon: Corbin Ade, MD;  [facility];  Service: Endoscopy;  Laterality: N/A;   COLONOSCOPY     COLONOSCOPY N/A 05/15/2018   Procedure: COLONOSCOPY;  Surgeon: Corbin Ade, MD;  [facility];  Service: Endoscopy;  Laterality: N/A;  7:30   EYE SURGERY Bilateral apirl, may 2013   cataract lens replacement   LYMPHADENECTOMY Bilateral 10/01/2014   Procedure: LYMPHADENECTOMY;  Surgeon: Heloise Purpura, MD;  [facility];  Service: Urology;  Laterality: Bilateral;   POLYPECTOMY  05/15/2018   Procedure: POLYPECTOMY;  Surgeon: Corbin Ade, MD;  [facility];  Service: Endoscopy;;  recto-sigmoid colon x2; splenic flexure(cs);   PROSTATE BIOPSY  dec 2015   ROBOT ASSISTED LAPAROSCOPIC RADICAL PROSTATECTOMY N/A 10/01/2014   Procedure: ROBOTIC ASSISTED LAPAROSCOPIC RADICAL PROSTATECTOMY LEVEL 2;  Surgeon: Heloise Purpura, MD;  [facility];  Service: Urology;  Laterality: N/A;   TONSILLECTOMY  as child   TRIGGER FINGER RELEASE  10/10/2011   Procedure: RELEASE TRIGGER FINGER/A-1 PULLEY;  Surgeon: Nicki Reaper, MD;  Location: [facility];  Service: Orthopedics;  Laterality: Right;  right thumb   TRIGGER FINGER RELEASE Right 05/23/2016   Procedure: RELEASE TRIGGER FINGER/A-1 PULLEY right index finger;  Surgeon: Cindee Salt, MD;  Location: [facility];  Service: Orthopedics;  Laterality: Right;  FAB   TRIGGER FINGER RELEASE Left 07/18/2016   Procedure: RELEASE TRIGGER FINGER/A-1 PULLEY, left index;  Surgeon: Cindee Salt, MD;  Location: [facility];  Service: Orthopedics;  Laterality: Left;  FAB  TRIGGER FINGER RELEASE Left 03/29/2021   Procedure: RELEASE TRIGGER FINGER/A-1 PULLEY LEFT MIDDLE FINGER;  Surgeon: Cindee Salt, MD;  Location: [facility];  Service: Orthopedics;  Laterality: Left;   YAG LASER APPLICATION Right 09/05/2016   Procedure: YAG LASER APPLICATION;  Surgeon: Jethro Bolus, MD;  [facility];  Service:  Ophthalmology;  Laterality: Right;   YAG LASER APPLICATION Left 09/26/2016   Procedure: YAG LASER APPLICATION;  Surgeon: Jethro Bolus, MD;  [facility];  Service: Ophthalmology;  Laterality: Left;    Past Family History   Family History  Problem Relation Age of Onset   Diabetes Mother    Heart disease Mother    Other Father        age 39, deceased form of lupus   Colon cancer Neg Hx     Past Social History   Social History   Socioeconomic History   Marital status: Married    Spouse name: Not on file   Number of children: 2   Years of education: Not on file   Highest education level: Not on file  Occupational History   Not on file  Tobacco Use   Smoking status: Former    Current packs/day: 1.00    Average packs/day: 1 pack/day for 40.0 years (40.0 ttl pk-yrs)    Types: Cigarettes   Smokeless tobacco: Former    Quit date: 10/03/2005  Vaping Use   Vaping status: Never Used  Substance and Sexual Activity   Alcohol use: Yes    Alcohol/week: 6.0 standard drinks of alcohol    Types: 6 Standard drinks or equivalent per week    Comment: occasional   Drug use: No   Sexual activity: Not on file  Other Topics Concern   Not on file  Social History Narrative   Not on file   Social Determinants of Health   Financial Resource Strain: Not on file  Food Insecurity: Not on file  Transportation Needs: Not on file  Physical Activity: Not on file  Stress: Not on file  Social Connections: Not on file  Intimate Partner Violence: Not on file    Review of Systems   General: Negative for anorexia, weight loss, fever, chills, fatigue, weakness. Eyes: Negative for vision changes.  ENT: Negative for hoarseness, difficulty swallowing , nasal congestion. CV: Negative for chest pain, angina, palpitations, dyspnea on exertion, peripheral edema.  Respiratory: Negative for dyspnea at rest, dyspnea on exertion, cough, sputum, wheezing.  GI: See history of present illness. GU:   Negative for dysuria, hematuria, urinary incontinence, urinary frequency, nocturnal urination.  MS: Negative for joint pain, low back pain.  Derm: Negative for rash or itching.  Neuro: Negative for weakness, abnormal sensation, seizure, frequent headaches, memory loss,  confusion.  Psych: Negative for anxiety, depression, suicidal ideation, hallucinations.  Endo: Negative for unusual weight change.  Heme: Negative for bruising or bleeding. Allergy: Negative for rash or hives.  Physical Exam   BP (!) 147/81 (BP Location: Right Arm, Patient Position: Sitting, Cuff Size: Large)   Pulse (!) 52   Temp 98.4 F (36.9 C) (Oral)   Ht 5\' 10"  (1.778 m)   Wt 227 lb 9.6 oz (103.2 kg)   SpO2 96%   BMI 32.66 kg/m    General: Well-nourished, well-developed in no acute distress.  Head: Normocephalic, atraumatic.   Eyes: Conjunctiva pink, no icterus. Mouth: Oropharyngeal mucosa moist and pink  Neck: Supple without thyromegaly, masses, or lymphadenopathy.  Lungs: Clear to auscultation bilaterally.  Heart:  Regular rate and rhythm, no murmurs rubs or gallops.  Abdomen: Bowel sounds are normal, nontender, nondistended, no hepatosplenomegaly or masses,  no abdominal bruits or hernia, no rebound or guarding.   Rectal: not performed Extremities: No lower extremity edema. No clubbing or deformities.  Neuro: Alert and oriented x 4 , grossly normal neurologically.  Skin: Warm and dry, no rash or jaundice.   Psych: Alert and cooperative, normal mood and affect.  Labs   Lab Results  Component Value Date   NA 142 10/28/2021   CL 105 10/28/2021   K 4.4 10/28/2021   CO2 24 10/28/2021   BUN 20 10/28/2021   CREATININE 0.97 10/28/2021   EGFR 81 10/28/2021   CALCIUM 9.5 10/28/2021   ALBUMIN 4.0 09/30/2018   GLUCOSE 92 10/28/2021   Lab Results  Component Value Date   ALT 22 09/30/2018   AST 24 09/30/2018   ALKPHOS 49 09/30/2018   BILITOT 1.2 09/30/2018   Lab Results  Component Value Date   WBC  9.4 07/24/2021   HGB 16.3 07/24/2021   HCT 49.0 07/24/2021   MCV 96.3 07/24/2021   PLT 145 (L) 07/24/2021    Imaging Studies   No results found.  Assessment   *H/O colon polyps  Patient in relatively good health and desires surveillance colonoscopy at this time. Discussed this would potentially be his last colonoscopy due to guidelines/age.   PLAN   Colonoscopy with Dr. Jena Gauss. ASA 3.  I have discussed the risks, alternatives, benefits with regards to but not limited to the risk of reaction to medication, bleeding, infection, perforation and the patient is agreeable to proceed. Written consent to be obtained. Patient requesting small volume prep.    Leanna Battles. Melvyn Neth, MHS, PA-C [facility]

## 2023-05-11 NOTE — Patient Instructions (Signed)
Colonoscopy to be scheduled. 

## 2023-05-14 ENCOUNTER — Encounter: Payer: Self-pay | Admitting: *Deleted

## 2023-05-15 ENCOUNTER — Encounter: Payer: Self-pay | Admitting: Gastroenterology

## 2023-06-29 ENCOUNTER — Encounter (HOSPITAL_COMMUNITY): Payer: Self-pay

## 2023-06-29 ENCOUNTER — Encounter (HOSPITAL_COMMUNITY)
Admission: RE | Admit: 2023-06-29 | Discharge: 2023-06-29 | Disposition: A | Discharge status: Home / Self Care | Payer: Medicare PPO | Source: Ambulatory Visit | Attending: Internal Medicine | Admitting: Internal Medicine

## 2023-07-04 ENCOUNTER — Encounter (HOSPITAL_COMMUNITY): Payer: Self-pay | Admitting: Internal Medicine

## 2023-07-04 ENCOUNTER — Ambulatory Visit (HOSPITAL_COMMUNITY)
Admission: RE | Admit: 2023-07-04 | Discharge: 2023-07-04 | Disposition: A | Discharge status: Home / Self Care | Payer: Medicare PPO | Attending: Internal Medicine | Admitting: Internal Medicine

## 2023-07-04 ENCOUNTER — Encounter (HOSPITAL_COMMUNITY)
Admission: RE | Disposition: A | Discharge status: Home / Self Care | Payer: Self-pay | Source: Home / Self Care | Attending: Internal Medicine

## 2023-07-04 ENCOUNTER — Ambulatory Visit (HOSPITAL_COMMUNITY): Payer: Medicare PPO | Admitting: Anesthesiology

## 2023-07-04 DIAGNOSIS — D123 Benign neoplasm of transverse colon: Secondary | ICD-10-CM | POA: Diagnosis not present

## 2023-07-04 DIAGNOSIS — K640 First degree hemorrhoids: Secondary | ICD-10-CM | POA: Insufficient documentation

## 2023-07-04 DIAGNOSIS — Z87891 Personal history of nicotine dependence: Secondary | ICD-10-CM | POA: Diagnosis not present

## 2023-07-04 DIAGNOSIS — K5730 Diverticulosis of large intestine without perforation or abscess without bleeding: Secondary | ICD-10-CM | POA: Diagnosis not present

## 2023-07-04 DIAGNOSIS — Z860100 Personal history of colon polyps, unspecified: Secondary | ICD-10-CM

## 2023-07-04 DIAGNOSIS — Z860101 Personal history of adenomatous and serrated colon polyps: Secondary | ICD-10-CM | POA: Insufficient documentation

## 2023-07-04 DIAGNOSIS — I10 Essential (primary) hypertension: Secondary | ICD-10-CM | POA: Diagnosis not present

## 2023-07-04 DIAGNOSIS — K635 Polyp of colon: Secondary | ICD-10-CM | POA: Diagnosis not present

## 2023-07-04 DIAGNOSIS — Z1211 Encounter for screening for malignant neoplasm of colon: Secondary | ICD-10-CM

## 2023-07-04 HISTORY — PX: POLYPECTOMY: SHX5525

## 2023-07-04 HISTORY — PX: COLONOSCOPY WITH PROPOFOL: SHX5780

## 2023-07-04 SURGERY — COLONOSCOPY WITH PROPOFOL
Anesthesia: General

## 2023-07-04 MED ORDER — LIDOCAINE HCL (PF) 2 % IJ SOLN
INTRAMUSCULAR | Status: DC | PRN
  Administered 2023-07-04: 50 mg via INTRADERMAL

## 2023-07-04 MED ORDER — PROPOFOL 500 MG/50ML IV EMUL
INTRAVENOUS | Status: DC | PRN
  Administered 2023-07-04: 100 mg via INTRAVENOUS

## 2023-07-04 MED ORDER — STERILE WATER FOR IRRIGATION IR SOLN
Status: DC | PRN
  Administered 2023-07-04: 60 mL

## 2023-07-04 MED ORDER — LACTATED RINGERS IV SOLN: INTRAVENOUS | Status: DC

## 2023-07-04 MED ORDER — PROPOFOL 10 MG/ML IV BOLUS
INTRAVENOUS | Status: DC | PRN
  Administered 2023-07-04: 125 ug/kg/min via INTRAVENOUS

## 2023-07-04 NOTE — Anesthesia Preprocedure Evaluation (Signed)
Anesthesia Evaluation  Patient identified by MRN, date of birth, ID band Patient awake    Reviewed: Allergy & Precautions, H&P , NPO status , Patient's Chart, lab work & pertinent test results, reviewed documented beta blocker date and time   Airway Mallampati: II  TM Distance: >3 FB Neck ROM: full    Dental no notable dental hx.    Pulmonary neg pulmonary ROS, former smoker   Pulmonary exam normal breath sounds clear to auscultation       Cardiovascular Exercise Tolerance: Good hypertension, negative cardio ROS  Rhythm:regular Rate:Normal     Neuro/Psych negative neurological ROS  negative psych ROS   GI/Hepatic negative GI ROS, Neg liver ROS,,,  Endo/Other  negative endocrine ROS    Renal/GU negative Renal ROS  negative genitourinary   Musculoskeletal   Abdominal   Peds  Hematology negative hematology ROS (+)   Anesthesia Other Findings   Reproductive/Obstetrics negative OB ROS                             Anesthesia Physical Anesthesia Plan  ASA: 3  Anesthesia Plan: General   Post-op Pain Management:    Induction:   PONV Risk Score and Plan: Propofol infusion  Airway Management Planned:   Additional Equipment:   Intra-op Plan:   Post-operative Plan:   Informed Consent: I have reviewed the patients History and Physical, chart, labs and discussed the procedure including the risks, benefits and alternatives for the proposed anesthesia with the patient or authorized representative who has indicated his/her understanding and acceptance.     Dental Advisory Given  Plan Discussed with: CRNA  Anesthesia Plan Comments:        Anesthesia Quick Evaluation

## 2023-07-04 NOTE — Discharge Instructions (Signed)
  Colonoscopy Discharge Instructions  Read the instructions outlined below and refer to this sheet in the next few weeks. These discharge instructions provide you with general information on caring for yourself after you leave the hospital. Your doctor may also give you specific instructions. While your treatment has been planned according to the most current medical practices available, unavoidable complications occasionally occur. If you have any problems or questions after discharge, call Dr. Jena Gauss at [phone]. ACTIVITY You may resume your regular activity, but move at a slower pace for the next 24 hours.  Take frequent rest periods for the next 24 hours.  Walking will help get rid of the air and reduce the bloated feeling in your belly (abdomen).  No driving for 24 hours (because of the medicine (anesthesia) used during the test).   Do not sign any important legal documents or operate any machinery for 24 hours (because of the anesthesia used during the test).  NUTRITION Drink plenty of fluids.  You may resume your normal diet as instructed by your doctor.  Begin with a light meal and progress to your normal diet. Heavy or fried foods are harder to digest and may make you feel sick to your stomach (nauseated).  Avoid alcoholic beverages for 24 hours or as instructed.  MEDICATIONS You may resume your normal medications unless your doctor tells you otherwise.  WHAT YOU CAN EXPECT TODAY Some feelings of bloating in the abdomen.  Passage of more gas than usual.  Spotting of blood in your stool or on the toilet paper.  IF YOU HAD POLYPS REMOVED DURING THE COLONOSCOPY: No aspirin products for 7 days or as instructed.  No alcohol for 7 days or as instructed.  Eat a soft diet for the next 24 hours.  FINDING OUT THE RESULTS OF YOUR TEST Not all test results are available during your visit. If your test results are not back during the visit, make an appointment with your caregiver to find out the  results. Do not assume everything is normal if you have not heard from your caregiver or the medical facility. It is important for you to follow up on all of your test results.  SEEK IMMEDIATE MEDICAL ATTENTION IF: You have more than a spotting of blood in your stool.  Your belly is swollen (abdominal distention).  You are nauseated or vomiting.  You have a temperature over 101.  You have abdominal pain or discomfort that is severe or gets worse throughout the day.     1 small polyp found and removed today  Diverticulosis information provided  Further recommendations to follow pending review of pathology report  At patient request, I called Steva Ready at [phone] -  reviewed findings and recommendations

## 2023-07-04 NOTE — Transfer of Care (Signed)
Immediate Anesthesia Transfer of Care Note  Patient: Erik Murray  Procedure(s) Performed: COLONOSCOPY WITH PROPOFOL POLYPECTOMY  Patient Location: Short Stay  Anesthesia Type:General  Level of Consciousness: awake, alert , oriented, and patient cooperative  Airway & Oxygen Therapy: Patient Spontanous Breathing  Post-op Assessment: Report given to RN, Post -op Vital signs reviewed and stable, and Patient moving all extremities X 4  Post vital signs: Reviewed and stable  Last Vitals:  Vitals Value Taken Time  BP 121/57 07/04/23 1429  Temp 36.4 C 07/04/23 1429  Pulse    Resp 18 07/04/23 1429  SpO2 97 % 07/04/23 1429    Last Pain:  Vitals:   07/04/23 1429  TempSrc: Axillary  PainSc: Asleep      Patients Stated Pain Goal: 5 (07/04/23 1145)  Complications: No notable events documented.

## 2023-07-04 NOTE — Op Note (Signed)
[facility] Patient Name: Erik Murray Procedure Date: 07/04/2023 1:36 PM MRN: [id number] Date of Birth: [birth year] Attending MD: Gennette Pac , MD, [id number] CSN: [id number] Age: 77 Admit Type: Outpatient Procedure:                Colonoscopy Indications:              High risk colon cancer surveillance: Personal                            history of colonic polyps Providers:                Gennette Pac, MD, Nena Polio, RN, Francoise Ceo RN, RN, Elinor Parkinson Referring MD:              Medicines:                Propofol per Anesthesia Complications:            No immediate complications. Estimated Blood Loss:     Estimated blood loss was minimal. Estimated blood                            loss was minimal. Procedure:                Pre-Anesthesia Assessment:                           - Prior to the procedure, a History and Physical                            was performed, and patient medications and                            allergies were reviewed. The patient's tolerance of                            previous anesthesia was also reviewed. The risks                            and benefits of the procedure and the sedation                            options and risks were discussed with the patient.                            All questions were answered, and informed consent                            was obtained. Prior Anticoagulants: The patient has                            taken no anticoagulant or antiplatelet agents. ASA  Grade Assessment: III - A patient with severe                            systemic disease. After reviewing the risks and                            benefits, the patient was deemed in satisfactory                            condition to undergo the procedure.                           After obtaining informed consent, the colonoscope                            was passed  under direct vision. Throughout the                            procedure, the patient's blood pressure, pulse, and                            oxygen saturations were monitored continuously. The                            [id number]) scope was introduced through the                            anus and advanced to the the cecum, identified by                            appendiceal orifice and ileocecal valve. The                            colonoscopy was performed without difficulty. The                            patient tolerated the procedure well. The quality                            of the bowel preparation was adequate. The                            ileocecal valve, appendiceal orifice, and rectum                            were photographed. The entire colon was well                            visualized. Scope In: 2:09:05 PM Scope Out: 2:24:31 PM Scope Withdrawal Time: 0 hours 8 minutes 17 seconds  Total Procedure Duration: 0 hours 15 minutes 26 seconds  Findings:      The perianal and digital rectal examinations were normal.      A 4 mm polyp was found in the splenic flexure. The polyp was  semi-pedunculated. The polyp was removed with a cold snare. Resection       and retrieval were complete. Estimated blood loss was minimal.      Scattered small-mouthed diverticula were found in the sigmoid colon and       descending colon.      Non-bleeding internal hemorrhoids were found during retroflexion. The       hemorrhoids were mild, small and Grade I (internal hemorrhoids that do       not prolapse).      The exam was otherwise without abnormality on direct and retroflexion       views. Impression:               - One 4 mm polyp at the splenic flexure, removed                            with a cold snare. Resected and retrieved.                           - Diverticulosis in the sigmoid colon and in the                            descending colon.                            - Non-bleeding internal hemorrhoids.                           - The examination was otherwise normal on direct                            and retroflexion views. Moderate Sedation:      Moderate (conscious) sedation was personally administered by an       anesthesia professional. The following parameters were monitored: oxygen       saturation, heart rate, blood pressure, respiratory rate, EKG, adequacy       of pulmonary ventilation, and response to care. Recommendation:           - Patient has a contact number available for                            emergencies. The signs and symptoms of potential                            delayed complications were discussed with the                            patient. Return to normal activities tomorrow.                            Written discharge instructions were provided to the                            patient.                           - Advance diet as tolerated.                           -  Continue present medications.                           - Repeat colonoscopy date to be determined after                            pending pathology results are reviewed for                            surveillance.                           - Return to GI office (date not yet determined). Procedure Code(s):        --- Professional ---                           [id number], Colonoscopy, flexible; with removal of                            tumor(s), polyp(s), or other lesion(s) by snare                            technique Diagnosis Code(s):        --- Professional ---                           Z86.010, Personal history of colonic polyps                           D12.3, Benign neoplasm of transverse colon (hepatic                            flexure or splenic flexure)                           K64.0, First degree hemorrhoids                           K57.30, Diverticulosis of large intestine without                            perforation or abscess  without bleeding CPT copyright 2022 American [employer] Association. All rights reserved. The codes documented in this report are preliminary and upon coder review may  be revised to meet current compliance requirements. Gerrit Friends. Donasia Wimes, MD Gennette Pac, MD 07/04/2023 2:37:52 PM This report has been signed electronically. Number of Addenda: 0

## 2023-07-04 NOTE — H&P (Signed)
@LOGO @   Primary Care Physician:  Carylon Perches, MD Primary Gastroenterologist:  Dr. Jena Gauss  Pre-Procedure History & Physical: HPI:  Erik Murray is a 77 y.o. male here for surveillance colonoscopy.  History multiple colonic adenoma removed 2019 no bowel symptoms at this time.  Past Medical History:  Diagnosis Date   Arthritis    Cancer (HCC) 2016   prostate   Hx of adenomatous colonic polyps    PVC (premature ventricular contraction)     Past Surgical History:  Procedure Laterality Date   COLONOSCOPY  02/05/2012   ZOX:WRUEAVW and rectal polyps/few diverticulosis (adenomas). next TCS 01/2015   COLONOSCOPY N/A 02/10/2015   Procedure: COLONOSCOPY;  Surgeon: Corbin Ade, MD;  Location: [facility];  Service: Endoscopy;  Laterality: N/A;   COLONOSCOPY     COLONOSCOPY N/A 05/15/2018   Procedure: COLONOSCOPY;  Surgeon: Corbin Ade, MD;  Location: [facility];  Service: Endoscopy;  Laterality: N/A;  7:30   EYE SURGERY Bilateral apirl, may 2013   cataract lens replacement   LYMPHADENECTOMY Bilateral 10/01/2014   Procedure: LYMPHADENECTOMY;  Surgeon: Heloise Purpura, MD;  Location: [facility];  Service: Urology;  Laterality: Bilateral;   POLYPECTOMY  05/15/2018   Procedure: POLYPECTOMY;  Surgeon: Corbin Ade, MD;  Location: [facility];  Service: Endoscopy;;  recto-sigmoid colon x2; splenic flexure(cs);   PROSTATE BIOPSY  dec 2015   ROBOT ASSISTED LAPAROSCOPIC RADICAL PROSTATECTOMY N/A 10/01/2014   Procedure: ROBOTIC ASSISTED LAPAROSCOPIC RADICAL PROSTATECTOMY LEVEL 2;  Surgeon: Heloise Purpura, MD;  Location: [facility];  Service: Urology;  Laterality: N/A;   TONSILLECTOMY  as child   TRIGGER FINGER RELEASE  10/10/2011   Procedure: RELEASE TRIGGER FINGER/A-1 PULLEY;  Surgeon: Nicki Reaper, MD;  Location: [facility];  Service: Orthopedics;  Laterality: Right;  right thumb   TRIGGER FINGER RELEASE Right 05/23/2016   Procedure: RELEASE TRIGGER FINGER/A-1 PULLEY right  index finger;  Surgeon: Cindee Salt, MD;  Location: [facility];  Service: Orthopedics;  Laterality: Right;  FAB   TRIGGER FINGER RELEASE Left 07/18/2016   Procedure: RELEASE TRIGGER FINGER/A-1 PULLEY, left index;  Surgeon: Cindee Salt, MD;  Location: [facility];  Service: Orthopedics;  Laterality: Left;  FAB   TRIGGER FINGER RELEASE Left 03/29/2021   Procedure: RELEASE TRIGGER FINGER/A-1 PULLEY LEFT MIDDLE FINGER;  Surgeon: Cindee Salt, MD;  Location: [facility];  Service: Orthopedics;  Laterality: Left;   YAG LASER APPLICATION Right 09/05/2016   Procedure: YAG LASER APPLICATION;  Surgeon: Jethro Bolus, MD;  Location: [facility];  Service: Ophthalmology;  Laterality: Right;   YAG LASER APPLICATION Left 09/26/2016   Procedure: YAG LASER APPLICATION;  Surgeon: Jethro Bolus, MD;  Location: [facility];  Service: Ophthalmology;  Laterality: Left;    Prior to Admission medications   Medication Sig Start Date End Date Taking? Authorizing Provider  flecainide (TAMBOCOR) 100 MG tablet TAKE 1 TABLET(100 MG) BY MOUTH TWICE DAILY 07/31/22  Yes Tillery, Mariam Dollar, PA-C  metoprolol tartrate (LOPRESSOR) 25 MG tablet Take 0.5 tablets (12.5 mg total) by mouth 2 (two) times daily. 07/31/22  Yes Graciella Freer, PA-C    Allergies as of 05/11/2023   (No Known Allergies)    Family History  Problem Relation Age of Onset   Diabetes Mother    Heart disease Mother    Other Father        age 69, deceased form of lupus   Colon cancer Neg Hx  Social History   Socioeconomic History   Marital status: Married    Spouse name: Not on file   Number of children: 2   Years of education: Not on file   Highest education level: Not on file  Occupational History   Not on file  Tobacco Use   Smoking status: Former    Current packs/day: 1.00    Average packs/day: 1 pack/day for 40.0 years (40.0 ttl pk-yrs)    Types: Cigarettes   Smokeless tobacco: Former    Quit  date: 10/03/2005  Vaping Use   Vaping status: Never Used  Substance and Sexual Activity   Alcohol use: Yes    Alcohol/week: 6.0 standard drinks of alcohol    Types: 6 Standard drinks or equivalent per week    Comment: occasional   Drug use: No   Sexual activity: Not on file  Other Topics Concern   Not on file  Social History Narrative   Not on file   Social Determinants of Health   Financial Resource Strain: Not on file  Food Insecurity: Not on file  Transportation Needs: Not on file  Physical Activity: Not on file  Stress: Not on file  Social Connections: Not on file  Intimate Partner Violence: Not on file    Review of Systems: See HPI, otherwise negative ROS  Physical Exam: BP (!) 169/69   Pulse (!) 53   Temp 97.6 F (36.4 C) (Oral)   Resp 18   SpO2 95%  General:   Alert,  Well-developed, well-nourished, pleasant and cooperative in NAD Neck:  Supple; no masses or thyromegaly. No significant cervical adenopathy. Lungs:  Clear throughout to auscultation.   No wheezes, crackles, or rhonchi. No acute distress. Heart:  Regular rate and rhythm; no murmurs, clicks, rubs,  or gallops. Abdomen: Non-distended, normal bowel sounds.  Soft and nontender without appreciable mass or hepatosplenomegaly.   Impression/Plan: 77 year old gentleman with a history of colonic adenomas removed.  Here for surveillance colonoscopy.  The risks, benefits, limitations, alternatives and imponderables have been reviewed with the patient. Questions have been answered. All parties are agreeable.       Notice: This dictation was prepared with Dragon dictation along with smaller phrase technology. Any transcriptional errors that result from this process are unintentional and may not be corrected upon review.

## 2023-07-06 LAB — SURGICAL PATHOLOGY

## 2023-07-06 NOTE — Anesthesia Postprocedure Evaluation (Signed)
Anesthesia Post Note  Patient: Erik Murray  Procedure(s) Performed: COLONOSCOPY WITH PROPOFOL POLYPECTOMY  Patient location during evaluation: Phase II Anesthesia Type: General Level of consciousness: awake Pain management: pain level controlled Vital Signs Assessment: post-procedure vital signs reviewed and stable Respiratory status: spontaneous breathing and respiratory function stable Cardiovascular status: blood pressure returned to baseline and stable Postop Assessment: no headache and no apparent nausea or vomiting Anesthetic complications: no Comments: Late entry   No notable events documented.   Last Vitals:  Vitals:   07/04/23 1145 07/04/23 1429  BP: (!) 169/69 (!) 121/57  Pulse: (!) 53   Resp: 18 18  Temp: 36.4 C 36.4 C  SpO2: 95% 97%    Last Pain:  Vitals:   07/04/23 1429  TempSrc: Axillary  PainSc: Asleep                 Windell Norfolk

## 2023-07-10 ENCOUNTER — Encounter (HOSPITAL_COMMUNITY): Payer: Self-pay | Admitting: Internal Medicine

## 2023-07-13 ENCOUNTER — Encounter: Payer: Self-pay | Admitting: Internal Medicine

## 2023-07-24 ENCOUNTER — Other Ambulatory Visit: Payer: Self-pay | Admitting: Student

## 2023-09-21 ENCOUNTER — Other Ambulatory Visit (HOSPITAL_COMMUNITY): Payer: Self-pay | Admitting: Internal Medicine

## 2023-09-21 ENCOUNTER — Ambulatory Visit (HOSPITAL_COMMUNITY)
Admission: RE | Admit: 2023-09-21 | Discharge: 2023-09-21 | Disposition: A | Discharge status: Home / Self Care | Payer: Medicare PPO | Source: Ambulatory Visit | Attending: Internal Medicine | Admitting: Internal Medicine

## 2023-09-21 DIAGNOSIS — M79661 Pain in right lower leg: Secondary | ICD-10-CM | POA: Insufficient documentation

## 2023-10-01 ENCOUNTER — Encounter: Payer: Self-pay | Admitting: Cardiology

## 2023-10-01 ENCOUNTER — Ambulatory Visit: Payer: Medicare PPO | Attending: Cardiology | Admitting: Cardiology

## 2023-10-01 VITALS — BP 150/84 | HR 57 | Ht 70.0 in | Wt 225.0 lb

## 2023-10-01 DIAGNOSIS — R001 Bradycardia, unspecified: Secondary | ICD-10-CM

## 2023-10-01 DIAGNOSIS — I493 Ventricular premature depolarization: Secondary | ICD-10-CM | POA: Diagnosis not present

## 2023-10-01 NOTE — Progress Notes (Signed)
  Electrophysiology Office Note:   Date:  10/01/2023  ID:  Erik Murray, DOB 1946-03-31, MRN [id number]  Primary Cardiologist: None Primary Heart Failure: None Electrophysiologist: Iyahna Obriant Jorja Loa, MD      History of Present Illness:   Erik Murray is a 78 y.o. male with h/o PVCs seen today for routine electrophysiology followup.   Since last being seen in our clinic the patient reports doing well.  He notes no further PVCs.  He continues to be able to do all of his daily activities without restriction.  His blood pressure is elevated today, but he just got out of court.  For now he is happy with his control.  he denies chest pain, palpitations, dyspnea, PND, orthopnea, nausea, vomiting, dizziness, syncope, edema, weight gain, or early satiety.   Review of systems complete and found to be negative unless listed in HPI.   EP Information / Studies Reviewed:    EKG is ordered today. Personal review as below.        Risk Assessment/Calculations:             Physical Exam:   VS:  BP (!) 150/84 (BP Location: Left Arm, Patient Position: Sitting, Cuff Size: Large)   Pulse (!) 57   Ht 5\' 10"  (1.778 m)   Wt 225 lb (102.1 kg)   SpO2 97%   BMI 32.28 kg/m    Wt Readings from Last 3 Encounters:  10/01/23 225 lb (102.1 kg)  05/11/23 227 lb 9.6 oz (103.2 kg)  03/19/23 221 lb (100.2 kg)     GEN: Well nourished, well developed in no acute distress NECK: No JVD; No carotid bruits CARDIAC: Regular rate and rhythm, no murmurs, rubs, gallops RESPIRATORY:  Clear to auscultation without rales, wheezing or rhonchi  ABDOMEN: Soft, non-tender, non-distended EXTREMITIES:  No edema; No deformity   ASSESSMENT AND PLAN:    1.  PVCs: Currently on flecainide 100 mg twice daily, Toprol-XL 12.5 mg twice daily.  He is continuing to do well without further PVCs.  QRS remains narrow on his flecainide.  He feels much improved with less weakness and fatigue.  Erik Murray continue with current  management.  Follow up with EP APP in 12 months  Signed, Augusta Mirkin Jorja Loa, MD

## 2023-10-08 DIAGNOSIS — M1811 Unilateral primary osteoarthritis of first carpometacarpal joint, right hand: Secondary | ICD-10-CM | POA: Diagnosis not present

## 2023-10-08 DIAGNOSIS — M25531 Pain in right wrist: Secondary | ICD-10-CM | POA: Diagnosis not present

## 2023-10-08 DIAGNOSIS — M65312 Trigger thumb, left thumb: Secondary | ICD-10-CM | POA: Diagnosis not present

## 2023-10-29 ENCOUNTER — Other Ambulatory Visit: Payer: Self-pay | Admitting: Student

## 2023-11-19 DIAGNOSIS — M65312 Trigger thumb, left thumb: Secondary | ICD-10-CM | POA: Diagnosis not present

## 2023-11-19 DIAGNOSIS — M1811 Unilateral primary osteoarthritis of first carpometacarpal joint, right hand: Secondary | ICD-10-CM | POA: Diagnosis not present

## 2023-11-26 DIAGNOSIS — H43813 Vitreous degeneration, bilateral: Secondary | ICD-10-CM | POA: Diagnosis not present

## 2023-11-26 DIAGNOSIS — Z961 Presence of intraocular lens: Secondary | ICD-10-CM | POA: Diagnosis not present

## 2023-12-12 DIAGNOSIS — D225 Melanocytic nevi of trunk: Secondary | ICD-10-CM | POA: Diagnosis not present

## 2023-12-12 DIAGNOSIS — L578 Other skin changes due to chronic exposure to nonionizing radiation: Secondary | ICD-10-CM | POA: Diagnosis not present

## 2023-12-12 DIAGNOSIS — Z1283 Encounter for screening for malignant neoplasm of skin: Secondary | ICD-10-CM | POA: Diagnosis not present

## 2024-04-30 DIAGNOSIS — N5201 Erectile dysfunction due to arterial insufficiency: Secondary | ICD-10-CM | POA: Diagnosis not present

## 2024-04-30 DIAGNOSIS — C61 Malignant neoplasm of prostate: Secondary | ICD-10-CM | POA: Diagnosis not present

## 2024-05-07 DIAGNOSIS — M5186 Other intervertebral disc disorders, lumbar region: Secondary | ICD-10-CM | POA: Diagnosis not present

## 2024-05-07 DIAGNOSIS — Z79899 Other long term (current) drug therapy: Secondary | ICD-10-CM | POA: Diagnosis not present

## 2024-05-07 DIAGNOSIS — D696 Thrombocytopenia, unspecified: Secondary | ICD-10-CM | POA: Diagnosis not present

## 2024-05-07 DIAGNOSIS — R7301 Impaired fasting glucose: Secondary | ICD-10-CM | POA: Diagnosis not present

## 2024-05-07 DIAGNOSIS — C61 Malignant neoplasm of prostate: Secondary | ICD-10-CM | POA: Diagnosis not present

## 2024-05-09 DIAGNOSIS — I493 Ventricular premature depolarization: Secondary | ICD-10-CM | POA: Diagnosis not present

## 2024-05-09 DIAGNOSIS — Z8546 Personal history of malignant neoplasm of prostate: Secondary | ICD-10-CM | POA: Diagnosis not present

## 2024-05-09 DIAGNOSIS — R001 Bradycardia, unspecified: Secondary | ICD-10-CM | POA: Diagnosis not present

## 2024-05-09 DIAGNOSIS — R7301 Impaired fasting glucose: Secondary | ICD-10-CM | POA: Diagnosis not present

## 2024-05-09 DIAGNOSIS — Z23 Encounter for immunization: Secondary | ICD-10-CM | POA: Diagnosis not present

## 2024-05-09 DIAGNOSIS — D696 Thrombocytopenia, unspecified: Secondary | ICD-10-CM | POA: Diagnosis not present

## 2024-05-09 DIAGNOSIS — Z0001 Encounter for general adult medical examination with abnormal findings: Secondary | ICD-10-CM | POA: Diagnosis not present

## 2024-05-09 DIAGNOSIS — E785 Hyperlipidemia, unspecified: Secondary | ICD-10-CM | POA: Diagnosis not present

## 2024-08-28 ENCOUNTER — Other Ambulatory Visit: Payer: Self-pay | Admitting: Student

## 2024-08-29 NOTE — Telephone Encounter (Signed)
 In accordance with refill protocols, please review and address the following requirements before this medication refill can be authorized:  Labs

## 2024-09-30 ENCOUNTER — Ambulatory Visit: Admitting: Student
